# Patient Record
Sex: Male | Born: 1970 | Race: Black or African American | Hispanic: No | Smoking: Current every day smoker
Health system: Southern US, Community
[De-identification: ages and names within clinical notes are randomized; demographics above are authoritative.]

## PROBLEM LIST (undated history)

## (undated) ENCOUNTER — Emergency Department (HOSPITAL_COMMUNITY): Admission: EM | Payer: Self-pay | Source: Home / Self Care

## (undated) DIAGNOSIS — G459 Transient cerebral ischemic attack, unspecified: Secondary | ICD-10-CM

---

## 2012-03-29 ENCOUNTER — Encounter (HOSPITAL_COMMUNITY): Payer: Self-pay | Admitting: *Deleted

## 2012-03-29 ENCOUNTER — Emergency Department (HOSPITAL_COMMUNITY)
Admission: EM | Admit: 2012-03-29 | Discharge: 2012-03-29 | Disposition: A | Discharge status: Home / Self Care | Payer: Self-pay | Attending: Emergency Medicine | Admitting: Emergency Medicine

## 2012-03-29 DIAGNOSIS — F17200 Nicotine dependence, unspecified, uncomplicated: Secondary | ICD-10-CM | POA: Insufficient documentation

## 2012-03-29 DIAGNOSIS — Y9367 Activity, basketball: Secondary | ICD-10-CM | POA: Insufficient documentation

## 2012-03-29 DIAGNOSIS — H1130 Conjunctival hemorrhage, unspecified eye: Secondary | ICD-10-CM | POA: Insufficient documentation

## 2012-03-29 DIAGNOSIS — Y9239 Other specified sports and athletic area as the place of occurrence of the external cause: Secondary | ICD-10-CM | POA: Insufficient documentation

## 2012-03-29 DIAGNOSIS — W219XXA Striking against or struck by unspecified sports equipment, initial encounter: Secondary | ICD-10-CM | POA: Insufficient documentation

## 2012-03-29 MED ORDER — TOBRAMYCIN 0.3 % OP SOLN
2.0000 [drp] | Freq: Once | OPHTHALMIC | Status: AC
  Administered 2012-03-29: 2 [drp] via OPHTHALMIC
  Filled 2012-03-29: qty 5

## 2012-03-29 MED ORDER — TETRACAINE HCL 0.5 % OP SOLN
1.0000 [drp] | Freq: Once | OPHTHALMIC | Status: AC
  Administered 2012-03-29: 1 [drp] via OPHTHALMIC
  Filled 2012-03-29: qty 2

## 2012-03-29 MED ORDER — FLUORESCEIN SODIUM 1 MG OP STRP
1.0000 | ORAL_STRIP | Freq: Once | OPHTHALMIC | Status: AC
  Administered 2012-03-29: 1 via OPHTHALMIC

## 2012-03-29 NOTE — ED Notes (Signed)
Poked in lt eye when playing basketball

## 2012-03-31 NOTE — ED Provider Notes (Signed)
History     CSN: [id number]  Arrival date & time 03/29/12  1749   First MD Initiated Contact with Patient 03/29/12 1947      Chief Complaint  Patient presents with  . Eye Injury    (Consider location/radiation/quality/duration/timing/severity/associated sxs/prior treatment) HPI Comments: Nicholas Schmidt was playing basketball 3 days ago when he was accidentally poked in the left eye by his opponents finger.  He reports irritation which is worsened with blinking and increased clear eye tearing.  He has developed red eye medially and also states was seeing "spots" in his left field of vision immediately after the event which has since resolved.  His vision is now at baseline.  He has found no alleviators for his symptoms.  The history is provided by the patient.    History reviewed. No pertinent past medical history.  History reviewed. No pertinent past surgical history.  History reviewed. No pertinent family history.  History  Substance Use Topics  . Smoking status: Current Every Day Smoker  . Smokeless tobacco: Not on file  . Alcohol Use: No      Review of Systems  Constitutional: Negative for fever.  HENT: Negative for congestion, sore throat and facial swelling.   Eyes: Positive for pain and redness.  Respiratory: Negative for shortness of breath.   Cardiovascular: Negative for chest pain.  Gastrointestinal: Negative for nausea and vomiting.  Musculoskeletal: Negative for joint swelling and arthralgias.  Skin: Negative.  Negative for rash and wound.  Neurological: Negative for dizziness, light-headedness, numbness and headaches.  Psychiatric/Behavioral: Negative.     Allergies  Review of patient's allergies indicates no known allergies.  Home Medications  No current outpatient prescriptions on file.  BP 125/69  Pulse 76  Resp 18  SpO2 100%  Physical Exam  Nursing note and vitals reviewed. Constitutional: He appears well-developed and well-nourished.  HENT:   Head: Normocephalic and atraumatic.  Eyes: EOM are normal. Pupils are equal, round, and reactive to light. Right eye exhibits no discharge. Left eye exhibits discharge. Left eye exhibits no chemosis and no exudate. Left conjunctiva is injected. Left conjunctiva has a hemorrhage.  Slit lamp exam:      The left eye shows no corneal abrasion, no corneal flare, no hyphema and no fluorescein uptake.       Draining clear tears.  Eyelids everted with no fb.  Visual acuity 20/15 os,od, ou  Neck: Neck supple.  Cardiovascular: Normal rate.   Pulmonary/Chest: Effort normal.  Abdominal: Bowel sounds are normal.  Musculoskeletal: Normal range of motion.  Neurological: He is alert.  Skin: Skin is warm and dry.  Psychiatric: He has a normal mood and affect.    ED Course  Procedures (including critical care time)  Labs Reviewed - No data to display No results found.   1. Subconjunctival hemorrhage, traumatic       MDM  Pt given tobrex drops,  First dose given in ed.  Referral to Dr. Lita Mains for recheck if any new sx.  Reassurance given subconjunctival hemorrhage will resolve, but slowly.  The patient appears reasonably screened and/or stabilized for discharge and I doubt any other medical condition or other [facility] requiring further screening, evaluation, or treatment in the ED at this time prior to discharge.         Burgess Amor, PA 03/31/12 0104

## 2012-03-31 NOTE — ED Provider Notes (Signed)
Medical screening examination/treatment/procedure(s) were performed by non-physician practitioner and as supervising physician I was immediately available for consultation/collaboration.   Shelda Jakes, MD 03/31/12 [phone]

## 2012-04-16 ENCOUNTER — Emergency Department (HOSPITAL_COMMUNITY)
Admission: EM | Admit: 2012-04-16 | Discharge: 2012-04-16 | Disposition: A | Discharge status: Home / Self Care | Payer: Self-pay | Attending: Emergency Medicine | Admitting: Emergency Medicine

## 2012-04-16 ENCOUNTER — Encounter (HOSPITAL_COMMUNITY): Payer: Self-pay | Admitting: *Deleted

## 2012-04-16 ENCOUNTER — Emergency Department (HOSPITAL_COMMUNITY): Payer: Self-pay

## 2012-04-16 DIAGNOSIS — M25461 Effusion, right knee: Secondary | ICD-10-CM

## 2012-04-16 DIAGNOSIS — F17200 Nicotine dependence, unspecified, uncomplicated: Secondary | ICD-10-CM | POA: Insufficient documentation

## 2012-04-16 DIAGNOSIS — M25469 Effusion, unspecified knee: Secondary | ICD-10-CM | POA: Insufficient documentation

## 2012-04-16 DIAGNOSIS — M1710 Unilateral primary osteoarthritis, unspecified knee: Secondary | ICD-10-CM | POA: Insufficient documentation

## 2012-04-16 MED ORDER — HYDROCODONE-ACETAMINOPHEN 5-325 MG PO TABS: 1.0000 | ORAL_TABLET | Freq: Four times a day (QID) | ORAL | Status: AC | PRN

## 2012-04-16 NOTE — ED Notes (Signed)
C/o R knee pain x 1 year.  Localized to knee w/out radiation. Aching in character.  Relieved by ice and heat, but only for short time.  Patient has been using ibuprofen and ace wrap.

## 2012-04-16 NOTE — ED Notes (Signed)
Unable to locate

## 2012-04-16 NOTE — ED Provider Notes (Signed)
History     CSN: [id number]  Arrival date & time 04/16/12  1856   None     Chief Complaint  Patient presents with  . Knee Pain    (Consider location/radiation/quality/duration/timing/severity/associated sxs/prior treatment) HPI Comments: Pt states he stands and walks a lot at work but does not recall any injury to the knee.  No fever or chills.    Patient is a 41 y.o. male presenting with knee pain. The history is provided by the patient. No language interpreter was used.  Knee Pain This is a new problem. Episode onset: 2 days ago. The problem has been gradually worsening. Associated symptoms include joint swelling. Pertinent negatives include no chills, fever, numbness or weakness. The symptoms are aggravated by walking. He has tried NSAIDs, ice and heat for the symptoms. The treatment provided no relief.    History reviewed. No pertinent past medical history.  History reviewed. No pertinent past surgical history.  History reviewed. No pertinent family history.  History  Substance Use Topics  . Smoking status: Current Every Day Smoker  . Smokeless tobacco: Not on file  . Alcohol Use: No      Review of Systems  Constitutional: Negative for fever and chills.  Musculoskeletal: Positive for joint swelling.       Knee pain  Skin: Negative for wound.  Neurological: Negative for weakness and numbness.  All other systems reviewed and are negative.    Allergies  Review of patient's allergies indicates no known allergies.  Home Medications   Current Outpatient Rx  Name Route Sig Dispense Refill  . HYDROCODONE-ACETAMINOPHEN 5-325 MG PO TABS Oral Take 1 tablet by mouth every 6 (six) hours as needed for pain. 20 tablet 0    BP 133/68  Pulse 76  Temp 98 F (36.7 C) (Oral)  Resp 18  Ht 6\' 3"  (1.905 m)  Wt 225 lb (102.059 kg)  BMI 28.12 kg/m2  SpO2 98%  Physical Exam  Nursing note and vitals reviewed. Constitutional: He is oriented to person, place, and time. He  appears well-developed and well-nourished.  HENT:  Head: Normocephalic and atraumatic.  Eyes: EOM are normal.  Neck: Normal range of motion.  Cardiovascular: Normal rate, regular rhythm, normal heart sounds and intact distal pulses.   Pulmonary/Chest: Effort normal and breath sounds normal. No respiratory distress.  Abdominal: Soft. He exhibits no distension. There is no tenderness.  Musculoskeletal: He exhibits tenderness.       Right knee: He exhibits decreased range of motion, swelling and effusion. He exhibits no deformity, no laceration, no erythema, normal alignment, no LCL laxity and normal patellar mobility. tenderness found.  Neurological: He is alert and oriented to person, place, and time.  Skin: Skin is warm and dry.  Psychiatric: He has a normal mood and affect. Judgment normal.    ED Course  Procedures (including critical care time)  Labs Reviewed - No data to display Dg Knee Complete 4 Views Right  04/16/2012  *RADIOLOGY REPORT*  Clinical Data: Pain and swelling  RIGHT KNEE - COMPLETE 4+ VIEW  Comparison: None.  Findings: There is a moderate sized suprapatellar joint effusion.  There is sharpening of the tibial spines.  No fracture or subluxation.  No radiopaque foreign bodies or soft tissue calcifications.  An exostosis is noted arising the just below the tibial tubercle.  IMPRESSION:  1.  Joint effusion. 2.  Mild osteoarthritis.   Original Report Authenticated By: Rosealee Albee, M.D.      1. Knee effusion,  right       MDM  No fx  Knee immobilizer, ice Ibuprofen rx-hydrocodone, 20 F/u with dr. Hilda Lias or Carollee Herter, [place] 04/16/12 2133

## 2012-04-16 NOTE — ED Notes (Signed)
Rt knee swelling and pain, no injury

## 2012-04-17 NOTE — ED Provider Notes (Signed)
Medical screening examination/treatment/procedure(s) were performed by non-physician practitioner and as supervising physician I was immediately available for consultation/collaboration.   Jaiyon Wander W. Shenia Alan, MD 04/17/12 1241 

## 2012-05-21 ENCOUNTER — Emergency Department (HOSPITAL_COMMUNITY)
Admission: EM | Admit: 2012-05-21 | Discharge: 2012-05-21 | Disposition: A | Discharge status: Home / Self Care | Payer: Self-pay | Attending: Emergency Medicine | Admitting: Emergency Medicine

## 2012-05-21 ENCOUNTER — Encounter (HOSPITAL_COMMUNITY): Payer: Self-pay | Admitting: *Deleted

## 2012-05-21 DIAGNOSIS — M25569 Pain in unspecified knee: Secondary | ICD-10-CM | POA: Insufficient documentation

## 2012-05-21 DIAGNOSIS — M25461 Effusion, right knee: Secondary | ICD-10-CM

## 2012-05-21 DIAGNOSIS — F17200 Nicotine dependence, unspecified, uncomplicated: Secondary | ICD-10-CM | POA: Insufficient documentation

## 2012-05-21 DIAGNOSIS — Z791 Long term (current) use of non-steroidal anti-inflammatories (NSAID): Secondary | ICD-10-CM | POA: Insufficient documentation

## 2012-05-21 DIAGNOSIS — M25561 Pain in right knee: Secondary | ICD-10-CM

## 2012-05-21 DIAGNOSIS — M25469 Effusion, unspecified knee: Secondary | ICD-10-CM | POA: Insufficient documentation

## 2012-05-21 MED ORDER — NAPROXEN 500 MG PO TABS: 500.0000 mg | ORAL_TABLET | Freq: Two times a day (BID) | ORAL | Status: DC

## 2012-05-21 MED ORDER — HYDROCODONE-ACETAMINOPHEN 5-325 MG PO TABS: 1.0000 | ORAL_TABLET | Freq: Four times a day (QID) | ORAL | Status: AC | PRN

## 2012-05-21 NOTE — ED Notes (Signed)
Pt presents with right knee pain and swelling X 1 week.. No known injury. Rt leg proximal to knee has noted edema and warm to touch. Pulses distal to knee are present and strong. NAD noted.

## 2012-05-21 NOTE — ED Provider Notes (Signed)
Is a and a little is aHistory   This chart was scribed for Shelda Jakes, MD by Gerlean Ren, ED Scribe. This patient was seen in room APFT24/APFT24 and the patient's care was started at 3:32 PM    CSN: [id number]  Arrival date & time 05/21/12  1434   First MD Initiated Contact with Patient 05/21/12 1521      Chief Complaint  Patient presents with  . Knee Pain     The history is provided by the patient. No language interpreter was used.   Nicholas Schmidt is a 41 y.o. male who presents to the Emergency Department complaining of one week of gradual onset right knee pain and swelling with no known recent or past injury, fall, or trauma as cause.  Pt denies any numbness or tingling in right foot, locking or feeling that joint is loose but reports popping during movement.  Pt was seen here 10/18 for similar symptoms that resolved without intervention.  Pt has no h/o chronic medical conditions.  Pt is a current everyday smoker but denies alcohol use.   History reviewed. No pertinent past medical history.  History reviewed. No pertinent past surgical history.  History reviewed. No pertinent family history.  History  Substance Use Topics  . Smoking status: Current Every Day Smoker  . Smokeless tobacco: Not on file  . Alcohol Use: No      Review of Systems  Musculoskeletal:       Right knee pain.  Right knee swelling.  Neurological: Negative for numbness.    Allergies  Review of patient's allergies indicates no known allergies.  Home Medications   Current Outpatient Rx  Name  Route  Sig  Dispense  Refill  . IBUPROFEN 200 MG PO TABS   Oral   Take 800 mg by mouth every 8 (eight) hours as needed. Knee pain (OTC Medication)         . HYDROCODONE-ACETAMINOPHEN 5-325 MG PO TABS   Oral   Take 1-2 tablets by mouth every 6 (six) hours as needed for pain.   14 tablet   0   . NAPROXEN 500 MG PO TABS   Oral   Take 1 tablet (500 mg total) by mouth 2 (two) times daily.   14  tablet   0     BP 128/73  Pulse 90  Temp 98.3 F (36.8 C) (Oral)  Resp 18  Ht 6\' 3"  (1.905 m)  Wt 215 lb (97.523 kg)  BMI 26.87 kg/m2  SpO2 98%  Physical Exam  Nursing note and vitals reviewed. Constitutional: He is oriented to person, place, and time. He appears well-developed and well-nourished.  HENT:  Head: Normocephalic and atraumatic.  Mouth/Throat: Oropharynx is clear and moist.  Eyes: Conjunctivae normal and EOM are normal.  Neck: Normal range of motion. No tracheal deviation present.  Cardiovascular: Normal rate, regular rhythm and normal heart sounds.   No murmur heard. Pulmonary/Chest: Effort normal and breath sounds normal. He has no wheezes.  Abdominal: Bowel sounds are normal.  Musculoskeletal:       Right knee lateral joint line tenderness and effusion.  Knee cap normal.  Limited flexion, normal extension.   Neurological: He is alert and oriented to person, place, and time. No cranial nerve deficit. Coordination normal.  Skin: Skin is warm.  Psychiatric: He has a normal mood and affect.    ED Course  Procedures (including critical care time) DIAGNOSTIC STUDIES: Oxygen Saturation is 98% on room air, normal by my  interpretation.    COORDINATION OF CARE: 3:39 PM- Patient informed of clinical course, understands medical decision-making process, and agrees with plan.  Discussed seeing orthopedist.    Labs Reviewed - No data to display No results found. No results found for this or any previous visit. No results found.    1. Knee pain, right   2. Knee effusion, right       MDM  Patient was seen October 18 the same problem. X-rays at that time were reviewed and showed the some degenerative arthritis and effusion. Consistent with today's findings. Patient has not been able to see an orthopedist. However options have been provided for him this would be best if he could be seen by them but highly suspicious for internal ligament injury patellofemoral  syndrome or meniscal injury.  Patient still has his knee immobilizer he will use that we'll take anti-inflammatory medicines or regular basis and supplement with pain medicine as needed.     I personally performed the services described in this documentation, which was scribed in my presence. The recorded information has been reviewed and is accurate.           Shelda Jakes, MD 05/21/12 [phone]

## 2012-05-21 NOTE — ED Notes (Signed)
Pain , swelling rt knee for 1 week, No known injury,

## 2012-06-25 ENCOUNTER — Encounter (HOSPITAL_COMMUNITY): Payer: Self-pay

## 2012-06-25 ENCOUNTER — Emergency Department (HOSPITAL_COMMUNITY)
Admission: EM | Admit: 2012-06-25 | Discharge: 2012-06-25 | Disposition: A | Discharge status: Home / Self Care | Payer: Self-pay | Attending: Emergency Medicine | Admitting: Emergency Medicine

## 2012-06-25 DIAGNOSIS — L299 Pruritus, unspecified: Secondary | ICD-10-CM | POA: Insufficient documentation

## 2012-06-25 DIAGNOSIS — N51 Disorders of male genital organs in diseases classified elsewhere: Secondary | ICD-10-CM | POA: Insufficient documentation

## 2012-06-25 DIAGNOSIS — Z791 Long term (current) use of non-steroidal anti-inflammatories (NSAID): Secondary | ICD-10-CM | POA: Insufficient documentation

## 2012-06-25 DIAGNOSIS — N5089 Other specified disorders of the male genital organs: Secondary | ICD-10-CM

## 2012-06-25 DIAGNOSIS — F17200 Nicotine dependence, unspecified, uncomplicated: Secondary | ICD-10-CM | POA: Insufficient documentation

## 2012-06-25 NOTE — ED Provider Notes (Signed)
History    This chart was scribed for Geoffery Lyons, MD, MD by Smitty Pluck, ED Scribe. The patient was seen in room APA11 and the patient's care was started at 7:32AM.   CSN: [id number]  Arrival date & time 06/25/12  1324    Chief Complaint  Patient presents with  . Pruritis     The history is provided by the patient. No language interpreter was used.   Nicholas Schmidt is a 41 y.o. male who presents to the Emergency Department complaining of constant, moderate skin warts on penis onset 1 month ago. Pt reports that he is married and is monogamous. Reports having moderate pruritus. He denies penile discharge, bleeding from warts, dysuria, fever, chills, nausea, vomiting and any other symptoms.   History reviewed. No pertinent past medical history.  History reviewed. No pertinent past surgical history.  No family history on file.  History  Substance Use Topics  . Smoking status: Current Every Day Smoker  . Smokeless tobacco: Not on file  . Alcohol Use: No      Review of Systems  All other systems reviewed and are negative.   10 Systems reviewed and all are negative for acute change except as noted in the HPI.   Allergies  Review of patient's allergies indicates no known allergies.  Home Medications   Current Outpatient Rx  Name  Route  Sig  Dispense  Refill  . IBUPROFEN 200 MG PO TABS   Oral   Take 800 mg by mouth every 8 (eight) hours as needed. Knee pain (OTC Medication)         . NAPROXEN 500 MG PO TABS   Oral   Take 1 tablet (500 mg total) by mouth 2 (two) times daily.   14 tablet   0     BP 145/72  Pulse 89  Temp 97.9 F (36.6 C) (Oral)  Resp 20  Ht 6\' 3"  (1.905 m)  Wt 225 lb (102.059 kg)  BMI 28.12 kg/m2  SpO2 96%  Physical Exam  Nursing note and vitals reviewed. Constitutional: He is oriented to person, place, and time. He appears well-developed and well-nourished. No distress.  HENT:  Head: Normocephalic and atraumatic.  Eyes: EOM are  normal. Pupils are equal, round, and reactive to light.  Neck: Normal range of motion. Neck supple. No tracheal deviation present.  Pulmonary/Chest: Effort normal. No respiratory distress.  Abdominal: Soft. He exhibits no distension.  Genitourinary:       Multiple fleshy lesions present on shaft of penis. Otherwise penis appears normal.   Musculoskeletal: Normal range of motion.  Neurological: He is alert and oriented to person, place, and time.  Skin: Skin is warm and dry.  Psychiatric: He has a normal mood and affect. His behavior is normal.    ED Course  Procedures (including critical care time) DIAGNOSTIC STUDIES: Oxygen Saturation is 96% on room air, adequate by my interpretation.    COORDINATION OF CARE: 7:36 AM Discussed ED treatment with pt    Labs Reviewed - No data to display No results found.   No diagnosis found.    MDM  I suspect these are warts.  He needs to see dermatology.  Follow up information given.      I personally performed the services described in this documentation, which was scribed in my presence. The recorded information has been reviewed and is accurate.      Geoffery Lyons, MD 06/29/12 [phone]

## 2012-06-25 NOTE — ED Notes (Signed)
Pt c/o skin tags on penis with itching for a month.

## 2012-08-22 ENCOUNTER — Emergency Department (HOSPITAL_COMMUNITY)
Admission: EM | Admit: 2012-08-22 | Discharge: 2012-08-22 | Disposition: A | Discharge status: Home / Self Care | Payer: Self-pay | Attending: Emergency Medicine | Admitting: Emergency Medicine

## 2012-08-22 ENCOUNTER — Emergency Department (HOSPITAL_COMMUNITY): Payer: Self-pay

## 2012-08-22 ENCOUNTER — Encounter (HOSPITAL_COMMUNITY): Payer: Self-pay

## 2012-08-22 DIAGNOSIS — F17200 Nicotine dependence, unspecified, uncomplicated: Secondary | ICD-10-CM | POA: Insufficient documentation

## 2012-08-22 DIAGNOSIS — R51 Headache: Secondary | ICD-10-CM | POA: Insufficient documentation

## 2012-08-22 DIAGNOSIS — R42 Dizziness and giddiness: Secondary | ICD-10-CM | POA: Insufficient documentation

## 2012-08-22 DIAGNOSIS — H538 Other visual disturbances: Secondary | ICD-10-CM | POA: Insufficient documentation

## 2012-08-22 DIAGNOSIS — R61 Generalized hyperhidrosis: Secondary | ICD-10-CM | POA: Insufficient documentation

## 2012-08-22 LAB — COMPREHENSIVE METABOLIC PANEL
Albumin: 3.8 g/dL (ref 3.5–5.2)
Alkaline Phosphatase: 86 U/L (ref 39–117)
BUN: 9 mg/dL (ref 6–23)
Creatinine, Ser: 1.06 mg/dL (ref 0.50–1.35)
Potassium: 4.4 mEq/L (ref 3.5–5.1)
Total Protein: 7.6 g/dL (ref 6.0–8.3)

## 2012-08-22 LAB — CBC WITH DIFFERENTIAL/PLATELET
Basophils Absolute: 0 10*3/uL (ref 0.0–0.1)
Basophils Relative: 0 % (ref 0–1)
Eosinophils Absolute: 0.2 10*3/uL (ref 0.0–0.7)
Hemoglobin: 14.6 g/dL (ref 13.0–17.0)
MCH: 34 pg (ref 26.0–34.0)
MCHC: 34 g/dL (ref 30.0–36.0)
Monocytes Relative: 7 % (ref 3–12)
Neutrophils Relative %: 56 % (ref 43–77)
Platelets: 192 10*3/uL (ref 150–400)
RDW: 12.7 % (ref 11.5–15.5)

## 2012-08-22 MED ORDER — SODIUM CHLORIDE 0.9 % IV BOLUS (SEPSIS): 1000.0000 mL | Freq: Once | INTRAVENOUS | Status: DC

## 2012-08-22 MED ORDER — TRAMADOL HCL 50 MG PO TABS: 50.0000 mg | ORAL_TABLET | Freq: Four times a day (QID) | ORAL | Status: DC | PRN

## 2012-08-22 NOTE — ED Notes (Signed)
Pt alert & oriented x4, stable gait. Patient given discharge instructions, paperwork & prescription(s). Patient  instructed to stop at the registration desk to finish any additional paperwork. Patient verbalized understanding. Pt left department w/ no further questions. 

## 2012-08-22 NOTE — ED Notes (Signed)
Dr Zammit at bedside. 

## 2012-08-22 NOTE — ED Notes (Signed)
Pt reports for the past 3 weeks has had "white spots" in his visual field and had had dizziness.  Reports today head started hurting around 11 or 1200.  Pain is in left temple area.  Reports bent over today and when stood up, vision was gone and felt like was going to pass out.  Was also diaphoretic per pt's wife.  Wife caught pt before pt fell and said pt was also tachycardic.  CBG was 89 per wife.

## 2012-08-22 NOTE — ED Provider Notes (Signed)
History    This chart was scribed for Nicholas Lennert, MD by Melba Coon, ED Scribe. The patient was seen in room APA15/APA15 and the patient's care was started at 6:13PM.    CSN: [id number]  Arrival date & time 08/22/12  1745   First MD Initiated Contact with Patient 08/22/12 1804      No chief complaint on file.   (Consider location/radiation/quality/duration/timing/severity/associated sxs/prior treatment) Patient is a 42 y.o. male presenting with headaches. The history is provided by the patient. No language interpreter was used.  Headache Onset quality:  Gradual Duration:  1 day Timing:  Constant Progression:  Worsening Chronicity:  New Similar to prior headaches: no   Context comment:  Blurred vision Relieved by:  Nothing Worsened by:  Nothing tried Ineffective treatments:  None tried Associated symptoms: dizziness   Associated symptoms: no fever    Nicholas Schmidt is a 42 y.o. male who presents to the Emergency Department complaining of constant, moderate to severe headache with an onset this morning with associated dizziness since this morning and blurred vision (sees intermittent white spots) since 3 weeks ago. He reports his white dots are close enough to where he can "touch" them. He reports he could feel himself "rocking" with near syncope and diaphoresis and he was about to fall until his wife caught him. No known allergies. No other pertinent medical symptoms.  No past medical history on file.  No past surgical history on file.  No family history on file.  History  Substance Use Topics  . Smoking status: Current Every Day Smoker  . Smokeless tobacco: Not on file  . Alcohol Use: No     Review of Systems  Constitutional: Negative for fever.  Eyes: Positive for visual disturbance (blurred vision).  Neurological: Positive for dizziness and headaches.  All other systems reviewed and are negative.   Allergies  Review of patient's allergies indicates no  known allergies.  Home Medications   Current Outpatient Rx  Name  Route  Sig  Dispense  Refill  . ibuprofen (ADVIL,MOTRIN) 200 MG tablet   Oral   Take 800 mg by mouth every 8 (eight) hours as needed. Knee pain (OTC Medication)         . naproxen (NAPROSYN) 500 MG tablet   Oral   Take 1 tablet (500 mg total) by mouth 2 (two) times daily.   14 tablet   0     There were no vitals taken for this visit.  Physical Exam  Nursing note and vitals reviewed. Constitutional: He is oriented to person, place, and time. He appears well-developed.  HENT:  Head: Normocephalic and atraumatic.  Eyes: Conjunctivae and EOM are normal. No scleral icterus.  Neck: Neck supple. No thyromegaly present.  Cardiovascular: Normal rate and regular rhythm.  Exam reveals no gallop and no friction rub.   No murmur heard. Pulmonary/Chest: No stridor. He has no wheezes. He has no rales. He exhibits no tenderness.  Abdominal: He exhibits no distension. There is no tenderness. There is no rebound.  Musculoskeletal: Normal range of motion. He exhibits no edema.  Lymphadenopathy:    He has no cervical adenopathy.  Neurological: He is oriented to person, place, and time. Coordination normal.  Skin: No rash noted. No erythema.  Psychiatric: He has a normal mood and affect. His behavior is normal.    ED Course  Procedures (including critical care time)  DIAGNOSTIC STUDIES: Oxygen Saturation is 96% on room air, adequate by my interpretation.  COORDINATION OF CARE:  6:16PM - IV fluids, head CT without contrast, CBC with differential, and CMP will be ordered for Sydnee Cabal.    Labs Reviewed - No data to display No results found.   No diagnosis found.    MDM     The chart was scribed for me under my direct supervision.  I personally performed the history, physical, and medical decision making and all procedures in the evaluation of this patient.Nicholas Lennert, MD 08/22/12 [phone]

## 2012-08-28 ENCOUNTER — Inpatient Hospital Stay (HOSPITAL_COMMUNITY)
Admission: EM | Admit: 2012-08-28 | Discharge: 2012-08-29 | DRG: 069 | Disposition: A | Discharge status: Home / Self Care | Payer: MEDICAID | Attending: Internal Medicine | Admitting: Internal Medicine

## 2012-08-28 ENCOUNTER — Ambulatory Visit (HOSPITAL_COMMUNITY)
Admit: 2012-08-28 | Discharge: 2012-08-28 | Disposition: A | Discharge status: Home / Self Care | Payer: Self-pay | Attending: Internal Medicine | Admitting: Internal Medicine

## 2012-08-28 ENCOUNTER — Emergency Department (HOSPITAL_COMMUNITY): Payer: Self-pay

## 2012-08-28 ENCOUNTER — Encounter (HOSPITAL_COMMUNITY): Payer: Self-pay

## 2012-08-28 DIAGNOSIS — F17200 Nicotine dependence, unspecified, uncomplicated: Secondary | ICD-10-CM | POA: Diagnosis present

## 2012-08-28 DIAGNOSIS — Z720 Tobacco use: Secondary | ICD-10-CM | POA: Diagnosis present

## 2012-08-28 DIAGNOSIS — G459 Transient cerebral ischemic attack, unspecified: Principal | ICD-10-CM | POA: Diagnosis present

## 2012-08-28 DIAGNOSIS — R51 Headache: Secondary | ICD-10-CM | POA: Diagnosis present

## 2012-08-28 LAB — BASIC METABOLIC PANEL
CO2: 28 mEq/L (ref 19–32)
Glucose, Bld: 104 mg/dL — ABNORMAL HIGH (ref 70–99)
Potassium: 4.6 mEq/L (ref 3.5–5.1)
Sodium: 141 mEq/L (ref 135–145)

## 2012-08-28 LAB — CBC WITH DIFFERENTIAL/PLATELET
Eosinophils Absolute: 0.1 10*3/uL (ref 0.0–0.7)
Eosinophils Relative: 2 % (ref 0–5)
Hemoglobin: 14.9 g/dL (ref 13.0–17.0)
Lymphs Abs: 2 10*3/uL (ref 0.7–4.0)
MCH: 34.6 pg — ABNORMAL HIGH (ref 26.0–34.0)
MCV: 102.1 fL — ABNORMAL HIGH (ref 78.0–100.0)
Monocytes Relative: 6 % (ref 3–12)
RBC: 4.31 MIL/uL (ref 4.22–5.81)

## 2012-08-28 MED ORDER — HEPARIN SODIUM (PORCINE) 5000 UNIT/ML IJ SOLN
5000.0000 [IU] | Freq: Three times a day (TID) | INTRAMUSCULAR | Status: DC
  Filled 2012-08-28: qty 1

## 2012-08-28 MED ORDER — NICOTINE 14 MG/24HR TD PT24
14.0000 mg | MEDICATED_PATCH | Freq: Every day | TRANSDERMAL | Status: DC
  Administered 2012-08-28: 14 mg via TRANSDERMAL
  Filled 2012-08-28: qty 1

## 2012-08-28 MED ORDER — SODIUM CHLORIDE 0.9 % IV SOLN: INTRAVENOUS | Status: DC

## 2012-08-28 MED ORDER — ASPIRIN 325 MG PO TABS
325.0000 mg | ORAL_TABLET | Freq: Every day | ORAL | Status: DC
  Administered 2012-08-29: 325 mg via ORAL
  Filled 2012-08-28: qty 1

## 2012-08-28 MED ORDER — ASPIRIN 81 MG PO CHEW
324.0000 mg | CHEWABLE_TABLET | Freq: Once | ORAL | Status: AC
  Administered 2012-08-28: 324 mg via ORAL
  Filled 2012-08-28: qty 4

## 2012-08-28 MED ORDER — ONDANSETRON HCL 4 MG/2ML IJ SOLN: 4.0000 mg | Freq: Three times a day (TID) | INTRAMUSCULAR | Status: AC | PRN

## 2012-08-28 NOTE — ED Provider Notes (Addendum)
History     CSN: [id number]  Arrival date & time 08/28/12  4098   First MD Initiated Contact with Patient 08/28/12 [phone]      Chief Complaint  Patient presents with  . Dizziness    (Consider location/radiation/quality/duration/timing/severity/associated sxs/prior treatment) HPI Comments: Nicholas Schmidt is a 42 y.o. 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  The nurse at the facility took his blood pressure which was 180/?.  He was given some vinegar water to drink and rested in a quiet room before presenting here.  His symptoms are completely resolved,  Except he has developed a slight nagging headache over his left forehead.  He reports seeing spots in his field of vision for the past month,  Lasting briefly several times weekly but is not typically accompanied by the other symptoms per above.  He has tried excedrin migraine as it was suggested this cough be a migraine problem,  But makes him too jittery.  He has a strong family history of htn.    The history is provided by the patient.    History reviewed. No pertinent past medical history.  History reviewed. No pertinent past surgical history.  History reviewed. No pertinent family history.  History  Substance Use Topics  . Smoking status: Current Every Day Smoker  . Smokeless tobacco: Not on file  . Alcohol Use: No      Review of Systems  HENT: Negative for congestion, sore throat, rhinorrhea, neck pain and neck stiffness.   Eyes: Positive for visual disturbance. Negative for pain.  Respiratory: Negative for cough, chest tightness and shortness of breath.   Cardiovascular: Negative for chest pain.   Gastrointestinal: Negative for nausea and abdominal pain.  Genitourinary: Negative.   Musculoskeletal: Negative for joint swelling and arthralgias.  Skin: Negative.  Negative for rash and wound.  Neurological: Positive for speech difficulty, light-headedness, numbness and headaches. Negative for dizziness, syncope and weakness.  Psychiatric/Behavioral: Negative.     Allergies  Review of patient's allergies indicates no known allergies.  Home Medications   Current Outpatient Rx  Name  Route  Sig  Dispense  Refill  . traMADol (ULTRAM) 50 MG tablet   Oral   Take 1 tablet (50 mg total) by mouth every 6 (six) hours as needed for pain.   15 tablet   0     BP 130/67  Pulse 81  Temp(Src) 97.8 F (36.6 C) (Oral)  Resp 20  Ht 6\' 3"  (1.905 m)  Wt 220 lb (99.791 kg)  BMI 27.5 kg/m2  SpO2 100%  Physical Exam  Nursing note and vitals reviewed. Constitutional: He is oriented to person, place, and time. He appears well-developed and well-nourished.  HENT:  Head: Normocephalic and atraumatic.  Mouth/Throat: Oropharynx is clear and moist.  Eyes: EOM are normal. Pupils are equal, round, and reactive to light.  Neck: Normal range of motion. Neck supple.  Cardiovascular: Normal rate and normal heart sounds.   Pulmonary/Chest: Effort normal.  Abdominal: Soft. There is no tenderness.  Musculoskeletal: Normal range of motion.  Lymphadenopathy:    He has no cervical adenopathy.  Neurological: He is alert and oriented to person, place, and time. He  has normal strength. No sensory deficit. Gait normal. GCS eye subscore is 4. GCS verbal subscore is 5. GCS motor subscore is 6.  Normal heel-shin, normal rapid alternating movements. Cranial nerves III-XII intact.  No pronator drift.  Skin: Skin is warm and dry. No rash noted.  Psychiatric: He has a normal mood and affect. His speech is normal and behavior is normal. Thought content normal. Cognition and memory are normal.    ED Course   Procedures (including critical care time)  Labs Reviewed - No data to display No results found.   No diagnosis found.  Ct head ordered. Spoke with radiology - mri not available today.  Will plan teleneurology consult as well.  MDM  Ct head normal,  Labs normal.  Pt was evaluated by teleneurology  Dr. Guy Sandifer who recommends admission for TIA workup given risk factors.   Although states migraine is also still in differential list.   2:05 PM spoke to Dr. Karilyn Cota who will admit pt.   Date: 08/28/2012  Rate: 71  Rhythm: normal sinus rhythm  QRS Axis: normal  Intervals: normal  ST/T Wave abnormalities: normal  Conduction Disutrbances:Incomplete RBBB  Narrative Interpretation:   Old EKG Reviewed: unchanged       Burgess Amor, PA 08/28/12 1308  Burgess Amor, PA 08/28/12 1510  Burgess Amor, PA 08/28/12 1511  Burgess Amor, PA-C 09/13/12 1659

## 2012-08-28 NOTE — ED Notes (Signed)
Complain of dizziness from blood pressure being elevlated

## 2012-08-28 NOTE — ED Notes (Signed)
Pt is being transferred to [facility] for MRI then brought back to room 341.  Explained to pt, verbalized understanding.

## 2012-08-28 NOTE — ED Provider Notes (Signed)
Medical screening examination/treatment/procedure(s) were performed by non-physician practitioner and as supervising physician I was immediately available for consultation/collaboration.   Benny Lennert, MD 08/28/12 [phone]

## 2012-08-28 NOTE — H&P (Signed)
[facility] History and Physical  Nicholas Schmidt YNW:[id number] DOB: [birth year] DOA: 08/28/2012  Referring physician: ER physician.    Chief Complaint: Visual disturbance and sensory disturbance in the left side of the body.  HPI: Nicholas Schmidt is a 42 y.o. male who presents with symptoms of visual obscurations this morning at 5:30 AM associated with a left temporal headache. This lasted approximately 15 minutes. He was then still not feeling well and went into work. Approximately 9 AM he started to get tingling in his left forearm and his left foot. The left forearm also felt somewhat weak. He was able to walk. He also during this time noticed that he had dysarthria. 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 He was therefore sent to the emergency room. In the ER, his blood pressure was noted to be normal. Approximately one week ago, he noticed also some dizziness after he stooped to pick something up followed by almost a syncopal episode. He also has been describing visual disturbance about a week ago. He is a smoker. He is not normally hypertensive. He is not diabetic.   Review of Systems: .  Apart from history of present illness other systems negative.    Social History:  He is married, lives with his wife. 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 He is a smoker. Does not drink alcohol nor does he do any other drugs.  No Known Allergies  History reviewed. No pertinent family history. no family history of cerebrovascular disease. (be sure to complete)  Prior to Admission medications   Medication Sig Start Date End Date Taking? Authorizing Provider  aspirin-acetaminophen-caffeine (EXCEDRIN MIGRAINE) [phone] MG per tablet Take 1 tablet by mouth every 6 (six) hours as needed for pain.   Yes Historical Provider, MD  traMADol (ULTRAM) 50 MG tablet Take 1 tablet (50 mg  total) by mouth every 6 (six) hours as needed for pain. 08/22/12  Yes Benny Lennert, MD   Physical Exam: Filed Vitals:   08/28/12 1259 08/28/12 1300 08/28/12 1400 08/28/12 1442  BP: 122/78 123/76 119/80   Pulse:  82    Temp:    97.7 F (36.5 C)  TempSrc:      Resp: 16 16 16    Height:      Weight:      SpO2: 97% 97%       General:  He looks systemically well. He is alert.  Eyes: External ocular movements are normal. Funduscopy appears to be normal. It was her equal and reactive to light.  ENT: No abnormalities.  Neck: No lymphadenopathy.  Cardiovascular: Heart sounds are present and normal without murmurs or gallop rhythm. No carotid bruits.  Respiratory: Lung fields are clear.  Abdomen: Soft, nontender. No hepatosplenomegaly.  Skin: No rash.  Musculoskeletal: No abnormalities.  Psychiatric: Appropriate affect.  Neurologic: Alert and orientated. There does appear to be slight weakness in the left arm and left leg compared to the right side. There does not appear to be gross focal neurological deficits. There is no facial asymmetry. His speech is now normal.  Labs on Admission:  Basic Metabolic Panel:  Recent Labs Lab 08/22/12 1826 08/28/12 1058  NA 137 141  K 4.4 4.6  CL 100 106  CO2 28 28  GLUCOSE 87 104*  BUN 9 10  CREATININE 1.06 0.91  CALCIUM 9.7 9.5   Liver Function  Tests:  Recent Labs Lab 08/22/12 1826  AST 18  ALT 9  ALKPHOS 86  BILITOT 0.9  PROT 7.6  ALBUMIN 3.8     CBC:  Recent Labs Lab 08/22/12 1826 08/28/12 1058  WBC 9.1 7.1  NEUTROABS 5.1 4.6  HGB 14.6 14.9  HCT 42.9 44.0  MCV 100.0 102.1*  PLT 192 165      Radiological Exams on Admission: Ct Head Wo Contrast  08/28/2012  *RADIOLOGY REPORT*  Clinical Data: Dizziness, weakness.  CT HEAD WITHOUT CONTRAST  Technique:  Contiguous axial images were obtained from the base of the skull through the vertex without contrast.  Comparison: 08/22/2012  Findings: No acute  intracranial abnormality.  Specifically, no hemorrhage, hydrocephalus, mass lesion, acute infarction, or significant intracranial injury.  No acute calvarial abnormality. Visualized paranasal sinuses and mastoids clear.  Orbital soft tissues unremarkable.  IMPRESSION: Unremarkable study.   Original Report Authenticated By: Charlett Nose, M.D.     EKG: Independently reviewed. Normal sinus rhythm, no acute ST-T wave changes. No evidence of left ventricular hypertrophy.  Assessment/Plan Principal Problem:   TIA (transient ischemic attack) Active Problems:   Tobacco abuse   1. TIA. Possible migraine. 2. Tobacco abuse.  Plan: 1. Admit to telemetry. 2. MRI brain scan. I think he needs to have this done today because the history is not quite typical for TIA nor is it quite typical for migraine headaches. We will send him to [facility] to have the scan done. 3. TIA workup. Further recommendations will depend on patient's hospital progress.   Code Status: Full code.  Family Communication: Discussed plan with patient at the bedside.   Disposition Plan: Home in medically stable.  Time spent: 45 minutes.  Wilson Singer [facility] Pager [phone].  If 7PM-7AM, please contact night-coverage [email or web address] Password [facility] 08/28/2012, 3:22 PM

## 2012-08-29 ENCOUNTER — Inpatient Hospital Stay (HOSPITAL_COMMUNITY): Payer: Self-pay

## 2012-08-29 LAB — LIPID PANEL
HDL: 32 mg/dL — ABNORMAL LOW (ref >39)
LDL Cholesterol: 79 mg/dL (ref 0–99)
Total CHOL/HDL Ratio: 5.5 RATIO
Triglycerides: 324 mg/dL — ABNORMAL HIGH (ref <150)

## 2012-08-29 LAB — VITAMIN B12: Vitamin B-12: 753 pg/mL (ref 211–911)

## 2012-08-29 LAB — RAPID URINE DRUG SCREEN, HOSP PERFORMED
Amphetamines: NOT DETECTED
Cocaine: NOT DETECTED
Opiates: NOT DETECTED
Tetrahydrocannabinol: NOT DETECTED

## 2012-08-29 LAB — HEMOGLOBIN A1C: Mean Plasma Glucose: 91 mg/dL (ref <117)

## 2012-08-29 MED ORDER — ASPIRIN 325 MG PO TABS: 325.0000 mg | ORAL_TABLET | Freq: Every day | ORAL | Status: DC

## 2012-08-29 NOTE — ED Provider Notes (Signed)
Medical screening examination/treatment/procedure(s) were performed by non-physician practitioner and as supervising physician I was immediately available for consultation/collaboration.   Bijan Ridgley L Archer Moist, MD 08/29/12 0708 

## 2012-08-29 NOTE — Progress Notes (Signed)
Pt discharged with instructions.  Pt verbalized understanding.  MD was notified of the Carotid Doppler results prior to discharge.  She went back and and spoke with him about the results.  Pt left the floor ambulation in stable condition.

## 2012-08-29 NOTE — Discharge Summary (Signed)
Physician Discharge Summary  Patient ID: Nicholas Schmidt MRN: [id number] DOB/AGE: 1970/12/14 42 y.o.  Admit date: 08/28/2012 Discharge date: 08/29/2012  Discharge Diagnoses:  Principal Problem:   TIA (transient ischemic attack) Active Problems:   Tobacco abuse     Medication List    TAKE these medications       aspirin 325 MG tablet  Take 1 tablet (325 mg total) by mouth daily.     aspirin-acetaminophen-caffeine 250-250-65 MG per tablet  Commonly known as:  EXCEDRIN MIGRAINE  Take 1 tablet by mouth every 6 (six) hours as needed for pain.     traMADol 50 MG tablet  Commonly known as:  ULTRAM  Take 1 tablet (50 mg total) by mouth every 6 (six) hours as needed for pain.            Discharge Orders   Future Orders Complete By Expires     Activity as tolerated - No restrictions  As directed     Diet general  As directed     Discharge instructions  As directed     Comments:      Quit smoking       Follow-up Information   Follow up with health department. 2 consider outpatient echocardiogram       Disposition: 01-Home or Self Care  Discharged Condition: stable  Consults:  none  Labs:   Results for orders placed during the hospital encounter of 08/28/12 (from the past 48 hour(s))  BASIC METABOLIC PANEL     Status: Abnormal   Collection Time    08/28/12 10:58 AM      Result Value Range   Sodium 141  135 - 145 mEq/L   Potassium 4.6  3.5 - 5.1 mEq/L   Chloride 106  96 - 112 mEq/L   CO2 28  19 - 32 mEq/L   Glucose, Bld 104 (*) 70 - 99 mg/dL   BUN 10  6 - 23 mg/dL   Creatinine, Ser 8.29  0.50 - 1.35 mg/dL   Calcium 9.5  8.4 - 56.2 mg/dL   GFR calc non Af Amer >90  >90 mL/min   GFR calc Af Amer >90  >90 mL/min   Comment:            The eGFR has been calculated     using the CKD EPI equation.     This calculation has not been     validated in all clinical     situations.     eGFR's persistently     <90 mL/min signify     possible Chronic Kidney Disease.   CBC WITH DIFFERENTIAL     Status: Abnormal   Collection Time    08/28/12 10:58 AM      Result Value Range   WBC 7.1  4.0 - 10.5 K/uL   RBC 4.31  4.22 - 5.81 MIL/uL   Hemoglobin 14.9  13.0 - 17.0 g/dL   HCT 13.0  86.5 - 78.4 %   MCV 102.1 (*) 78.0 - 100.0 fL   MCH 34.6 (*) 26.0 - 34.0 pg   MCHC 33.9  30.0 - 36.0 g/dL   RDW 69.6  29.5 - 28.4 %   Platelets 165  150 - 400 K/uL   Neutrophils Relative 64  43 - 77 %   Neutro Abs 4.6  1.7 - 7.7 K/uL   Lymphocytes Relative 28  12 - 46 %   Lymphs Abs 2.0  0.7 - 4.0 K/uL   Monocytes Relative  6  3 - 12 %   Monocytes Absolute 0.4  0.1 - 1.0 K/uL   Eosinophils Relative 2  0 - 5 %   Eosinophils Absolute 0.1  0.0 - 0.7 K/uL   Basophils Relative 0  0 - 1 %   Basophils Absolute 0.0  0.0 - 0.1 K/uL  HEMOGLOBIN A1C     Status: None   Collection Time    08/28/12  3:21 PM      Result Value Range   Hemoglobin A1C 4.8  <5.7 %   Comment: (NOTE)                                                                               According to the ADA Clinical Practice Recommendations for 2011, when     HbA1c is used as a screening test:      >=6.5%   Diagnostic of Diabetes Mellitus               (if abnormal result is confirmed)     5.7-6.4%   Increased risk of developing Diabetes Mellitus     References:Diagnosis and Classification of Diabetes Mellitus,Diabetes     Care,2011,34(Suppl 1):S62-S69 and Standards of Medical Care in             Diabetes - 2011,Diabetes Care,2011,34 (Suppl 1):S11-S61.   Mean Plasma Glucose 91  <117 mg/dL  TSH     Status: None   Collection Time    08/28/12  3:45 PM      Result Value Range   TSH 1.166  0.350 - 4.500 uIU/mL  VITAMIN B12     Status: None   Collection Time    08/28/12  3:45 PM      Result Value Range   Vitamin B-12 753  211 - 911 pg/mL  LIPID PANEL     Status: Abnormal   Collection Time    08/29/12  4:54 AM      Result Value Range   Cholesterol 176  0 - 200 mg/dL   Triglycerides 161 (*) <150 mg/dL   HDL  32 (*) >09 mg/dL   Total CHOL/HDL Ratio 5.5     VLDL 65 (*) 0 - 40 mg/dL   LDL Cholesterol 79  0 - 99 mg/dL   Comment:            Total Cholesterol/HDL:CHD Risk     Coronary Heart Disease Risk Table                         Men   Women      1/2 Average Risk   3.4   3.3      Average Risk       5.0   4.4      2 X Average Risk   9.6   7.1      3 X Average Risk  23.4   11.0                Use the calculated Patient Ratio     above and the CHD Risk Table     to determine the patient's CHD Risk.  ATP III CLASSIFICATION (LDL):      <100     mg/dL   Optimal      782-956  mg/dL   Near or Above                        Optimal      130-159  mg/dL   Borderline      213-086  mg/dL   High      >578     mg/dL   Very High  URINE RAPID DRUG SCREEN (HOSP PERFORMED)     Status: None   Collection Time    08/29/12  6:47 AM      Result Value Range   Opiates NONE DETECTED  NONE DETECTED   Cocaine NONE DETECTED  NONE DETECTED   Benzodiazepines NONE DETECTED  NONE DETECTED   Amphetamines NONE DETECTED  NONE DETECTED   Tetrahydrocannabinol NONE DETECTED  NONE DETECTED   Barbiturates NONE DETECTED  NONE DETECTED   Comment:            DRUG SCREEN FOR MEDICAL PURPOSES     ONLY.  IF CONFIRMATION IS NEEDED     FOR ANY PURPOSE, NOTIFY LAB     WITHIN 5 DAYS.                LOWEST DETECTABLE LIMITS     FOR URINE DRUG SCREEN     Drug Class       Cutoff (ng/mL)     Amphetamine      1000     Barbiturate      200     Benzodiazepine   200     Tricyclics       300     Opiates          300     Cocaine          300     THC              50    Diagnostics:  Ct Head Wo Contrast  08/28/2012  *RADIOLOGY REPORT*  Clinical Data: Dizziness, weakness.  CT HEAD WITHOUT CONTRAST  Technique:  Contiguous axial images were obtained from the base of the skull through the vertex without contrast.  Comparison: 08/22/2012  Findings: No acute intracranial abnormality.  Specifically, no hemorrhage, hydrocephalus,  mass lesion, acute infarction, or significant intracranial injury.  No acute calvarial abnormality. Visualized paranasal sinuses and mastoids clear.  Orbital soft tissues unremarkable.  IMPRESSION: Unremarkable study.   Original Report Authenticated By: Charlett Nose, M.D.    Ct Head Wo Contrast  08/22/2012  *RADIOLOGY REPORT*  Clinical Data: 42 year old male with blurred vision.  CT HEAD WITHOUT CONTRAST  Technique:  Contiguous axial images were obtained from the base of the skull through the vertex without contrast.  Comparison: None  Findings: No intracranial abnormalities are identified, including mass lesion or mass effect, hydrocephalus, extra-axial fluid collection, midline shift, hemorrhage, or acute infarction.  The visualized bony calvarium is unremarkable.  IMPRESSION: Unremarkable noncontrast head CT   Original Report Authenticated By: Harmon Pier, M.D.    Mri Brain Without Contrast  08/28/2012  *RADIOLOGY REPORT*  Clinical Data:  Visual and sensory disturbance left-sided body. Left temporal headache.  MRI BRAIN WITHOUT CONTRAST MRA HEAD WITHOUT CONTRAST  Technique: Multiplanar, multiecho pulse sequences of the brain and surrounding structures were obtained according to standard protocol without intravenous contrast.  Angiographic images of the head were obtained using MRA  technique without contrast.  Comparison: 08/28/2012 head CT.  MRI HEAD  Findings:  Portions of exam are motion degraded.  No acute infarct.  No intracranial hemorrhage.  No intracranial mass lesion detected on this unenhanced exam.  No hydrocephalus.  Mild exophthalmos.  Partial opacification aerated aspect left pterygoid plate. Mild mucosal thickening inferior aspect of the maxillary sinuses. Minimal mucosal thickening paranasal sinuses otherwise noted.  Prominent soft tissue posterior-superior nasopharynx may represent adenoidal tissue which is more than expected for the patient's age. Mucosa abnormality not excluded although felt  to be a less likely consideration.  IMPRESSION: No acute infarct.  Please see above.  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  Proximal right internal carotid artery stenosis not excluded.  Left middle cerebral artery branch arises from the proximal A1 segment of the left anterior cerebral artery.  Nonvisualization PICAs.  No significant stenosis of the vertebral arteries or basilar artery.  Mild irregularity prominent AICA branches.  Mild irregularity proximal left superior cerebellar artery.  Mild irregularity distal branches posterior cerebral artery bilaterally.  No aneurysm or vascular malformation is noted.  IMPRESSION: Asymmetric caliber of the internal carotid arteries smaller on the right as detailed above.  Although this may be congenital configuration, proximal right internal carotid artery stenosis not excluded.  Branch vessel irregularity.   Original Report Authenticated By: Lacy Duverney, M.D.    Mr Mra Head/brain Wo Cm  08/28/2012  *RADIOLOGY REPORT*  Clinical Data:  Visual and sensory disturbance left-sided body. Left temporal headache.  MRI BRAIN WITHOUT CONTRAST MRA HEAD WITHOUT CONTRAST  Technique: Multiplanar, multiecho pulse sequences of the brain and surrounding structures were obtained according to standard protocol without intravenous contrast.  Angiographic images of the head were obtained using MRA technique without contrast.  Comparison: 08/28/2012 head CT.  MRI HEAD  Findings:  Portions of exam are motion degraded.  No acute infarct.  No intracranial hemorrhage.  No intracranial mass lesion detected on this unenhanced exam.  No hydrocephalus.  Mild exophthalmos.  Partial opacification aerated aspect left pterygoid plate. Mild mucosal  thickening inferior aspect of the maxillary sinuses. Minimal mucosal thickening paranasal sinuses otherwise noted.  Prominent soft tissue posterior-superior nasopharynx may represent adenoidal tissue which is more than expected for the patient's age. Mucosa abnormality not excluded although felt to be a less likely consideration.  IMPRESSION: No acute infarct.  Please see above.  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  Proximal right internal carotid artery stenosis not excluded.  Left middle cerebral artery branch arises from the proximal A1 segment of the left anterior cerebral artery.  Nonvisualization PICAs.  No significant stenosis of the vertebral arteries or basilar artery.  Mild irregularity prominent AICA branches.  Mild irregularity proximal left superior cerebellar artery.  Mild irregularity distal branches posterior cerebral artery bilaterally.  No aneurysm or vascular malformation is noted.  IMPRESSION: Asymmetric caliber of the internal carotid arteries smaller on the right as detailed above.  Although this may be congenital configuration, proximal right internal carotid artery  stenosis not excluded.  Branch vessel irregularity.   Original Report Authenticated By: Lacy Duverney, M.D.     EKG: NSR incomplete RBBB  Full Code   Hospital Course: See H&P for complete admission details. The patient is a 42 year old black male smoker who presented with transient speech difficulty and paresthesias of the left hand and foot. In the emergency room, he had normal vital signs. He had slight weakness of the left arm and leg her H&P. His speech was normal at that time. CT brain showed nothing acute. He was placed on observation. Started on aspirin. MRI  showed no infarct. Carotid Dopplers showed no critical ischemia. Patient's symptoms completely resolved. LDL was not high. Echocardiogram was ordered, but patient was unwilling to stay the weekend to have it performed on Monday. As his symptoms have resolved, this is reasonable. He is encouraged to quit smoking and followup with the health department to consider echocardiogram. He will be maintained on aspirin. His blood pressure and heart rhythm remained normal during the stay  Discharge Exam:  Blood pressure 107/62, pulse 82, temperature 98.2 F (36.8 C), temperature source Axillary, resp. rate 20, height 6\' 3"  (1.905 m), weight 94.212 kg (207 lb 11.2 oz), SpO2 97.00%.  General: Comfortable. Alert and oriented. Speech clear and fluent. Lungs clear to auscultation bilaterally without wheeze rhonchi or rales Cardiovascular regular rate rhythm without murmurs gallops rubs Abdomen soft nontender nondistended Extremities no clubbing cyanosis or edema Neurologic: Cranial nerves intact. Motor strength and sensation intact.  SignedChristiane Ha 08/29/2012, 11:49 AM

## 2012-08-30 LAB — FOLATE RBC: RBC Folate: 697 ng/mL — ABNORMAL HIGH (ref >=366)

## 2012-09-02 NOTE — Progress Notes (Signed)
UR Chart Review Completed  

## 2012-09-14 NOTE — ED Provider Notes (Signed)
Medical screening examination/treatment/procedure(s) were performed by non-physician practitioner and as supervising physician I was immediately available for consultation/collaboration.   Benny Lennert, MD 09/14/12 [phone]

## 2012-11-15 ENCOUNTER — Encounter (HOSPITAL_COMMUNITY): Payer: Self-pay | Admitting: *Deleted

## 2012-11-15 ENCOUNTER — Emergency Department (HOSPITAL_COMMUNITY)
Admission: EM | Admit: 2012-11-15 | Discharge: 2012-11-15 | Disposition: A | Discharge status: Home / Self Care | Payer: Self-pay | Attending: Emergency Medicine | Admitting: Emergency Medicine

## 2012-11-15 DIAGNOSIS — Z8673 Personal history of transient ischemic attack (TIA), and cerebral infarction without residual deficits: Secondary | ICD-10-CM | POA: Insufficient documentation

## 2012-11-15 DIAGNOSIS — S40269A Insect bite (nonvenomous) of unspecified shoulder, initial encounter: Secondary | ICD-10-CM | POA: Insufficient documentation

## 2012-11-15 DIAGNOSIS — Z7982 Long term (current) use of aspirin: Secondary | ICD-10-CM | POA: Insufficient documentation

## 2012-11-15 DIAGNOSIS — W57XXXA Bitten or stung by nonvenomous insect and other nonvenomous arthropods, initial encounter: Secondary | ICD-10-CM | POA: Insufficient documentation

## 2012-11-15 DIAGNOSIS — F17200 Nicotine dependence, unspecified, uncomplicated: Secondary | ICD-10-CM | POA: Insufficient documentation

## 2012-11-15 DIAGNOSIS — Y929 Unspecified place or not applicable: Secondary | ICD-10-CM | POA: Insufficient documentation

## 2012-11-15 DIAGNOSIS — S40862A Insect bite (nonvenomous) of left upper arm, initial encounter: Secondary | ICD-10-CM

## 2012-11-15 DIAGNOSIS — Y93E1 Activity, personal bathing and showering: Secondary | ICD-10-CM | POA: Insufficient documentation

## 2012-11-15 HISTORY — DX: Transient cerebral ischemic attack, unspecified: G45.9

## 2012-11-15 NOTE — ED Provider Notes (Signed)
History     CSN: [id number]  Arrival date & time 11/15/12  0736   First MD Initiated Contact with Patient 11/15/12 0809      Chief Complaint  Patient presents with  . Tick Removal    (Consider location/radiation/quality/duration/timing/severity/associated sxs/prior treatment) HPI Comments: Nicholas Schmidt is a 42 y.o. Male presenting with an embedded tick in his left axilla.  He found it this morning while showering and his wife attempted to remove it with a tweezers but was unable.  He reports itching at the site,  No pain, drainage, redness and denies fever or rash.     The history is provided by the patient.    Past Medical History  Diagnosis Date  . TIA (transient ischemic attack)     History reviewed. No pertinent past surgical history.  History reviewed. No pertinent family history.  History  Substance Use Topics  . Smoking status: Current Every Day Smoker -- 0.50 packs/day    Types: Cigarettes  . Smokeless tobacco: Not on file  . Alcohol Use: No      Review of Systems  Constitutional: Negative for fever.  HENT: Negative for sore throat and neck pain.   Eyes: Negative.   Respiratory: Negative.   Cardiovascular: Negative.   Gastrointestinal: Negative for nausea.  Genitourinary: Negative.   Musculoskeletal: Negative for joint swelling and arthralgias.  Skin: Negative.  Negative for rash and wound.  Neurological: Negative for weakness and headaches.  Psychiatric/Behavioral: Negative.     Allergies  Review of patient's allergies indicates no known allergies.  Home Medications   Current Outpatient Rx  Name  Route  Sig  Dispense  Refill  . aspirin 325 MG tablet   Oral   Take 1 tablet (325 mg total) by mouth daily.         Marland Kitchen aspirin-acetaminophen-caffeine (EXCEDRIN MIGRAINE) 250-250-65 MG per tablet   Oral   Take 1 tablet by mouth every 6 (six) hours as needed for pain.         . traMADol (ULTRAM) 50 MG tablet   Oral   Take 1 tablet (50 mg  total) by mouth every 6 (six) hours as needed for pain.   15 tablet   0     BP 130/83  Pulse 71  Temp(Src) 98.3 F (36.8 C) (Oral)  Ht 6\' 3"  (1.905 m)  Wt 220 lb (99.791 kg)  BMI 27.5 kg/m2  SpO2 100%  Physical Exam  Constitutional: He appears well-developed and well-nourished. No distress.  HENT:  Head: Normocephalic.  Neck: Neck supple.  Cardiovascular: Normal rate.   Pulmonary/Chest: Effort normal.  Musculoskeletal: Normal range of motion. He exhibits no edema.  Skin:  Small deer tick embedded in left axilla.  It is firmly attached without engorgement.    ED Course  FOREIGN BODY REMOVAL Date/Time: 11/15/2012 8:25 AM Performed by: Burgess Amor Authorized by: Burgess Amor Consent: Verbal consent obtained. Risks and benefits: risks, benefits and alternatives were discussed Consent given by: patient Patient identity confirmed: verbally with patient Time out: Immediately prior to procedure a "time out" was called to verify the correct patient, procedure, equipment, support staff and site/side marked as required. Body area: skin General location: trunk Location details: left axilla Removal mechanism: forceps Complexity: simple 1 objects recovered. Objects recovered: tick,  entirely Patient tolerance: Patient tolerated the procedure well with no immediate complications.   (including critical care time)  Labs Reviewed - No data to display No results found.   1. Tick bite of axillary  region, left, initial encounter       MDM  Discussed signs and symptoms requiring further treatment.  Advised to watch for any signs of fever, chills, body aches headaches or rash or other flulike symptoms over the next 2-3 weeks.        Burgess Amor, PA-C 11/15/12 [phone]

## 2012-11-15 NOTE — ED Notes (Signed)
Small tick L axilla area.

## 2012-11-15 NOTE — ED Provider Notes (Signed)
Medical screening examination/treatment/procedure(s) were performed by non-physician practitioner and as supervising physician I was immediately available for consultation/collaboration.  Shelda Jakes, MD 11/15/12 [phone]

## 2014-05-19 IMAGING — CR DG KNEE COMPLETE 4+V*R*
4 series · 4 of 4 positions shown · non-contrast
Comparison: None.

CLINICAL DATA: Pain and swelling

RIGHT KNEE - COMPLETE 4+ VIEW

[view not recorded (1 of 4)]
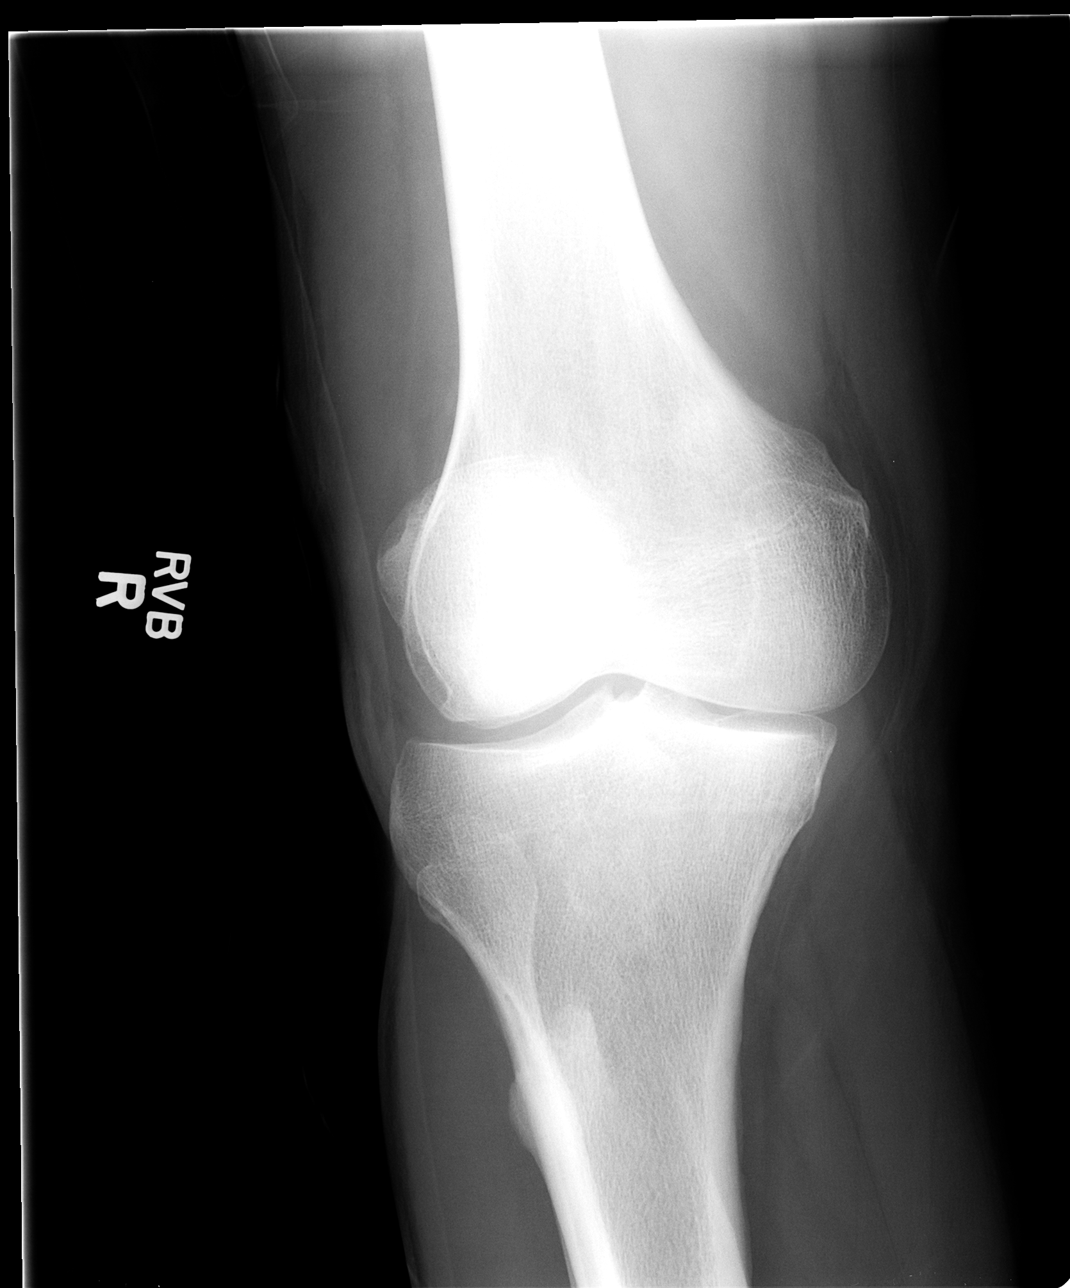

[view not recorded (2 of 4)]
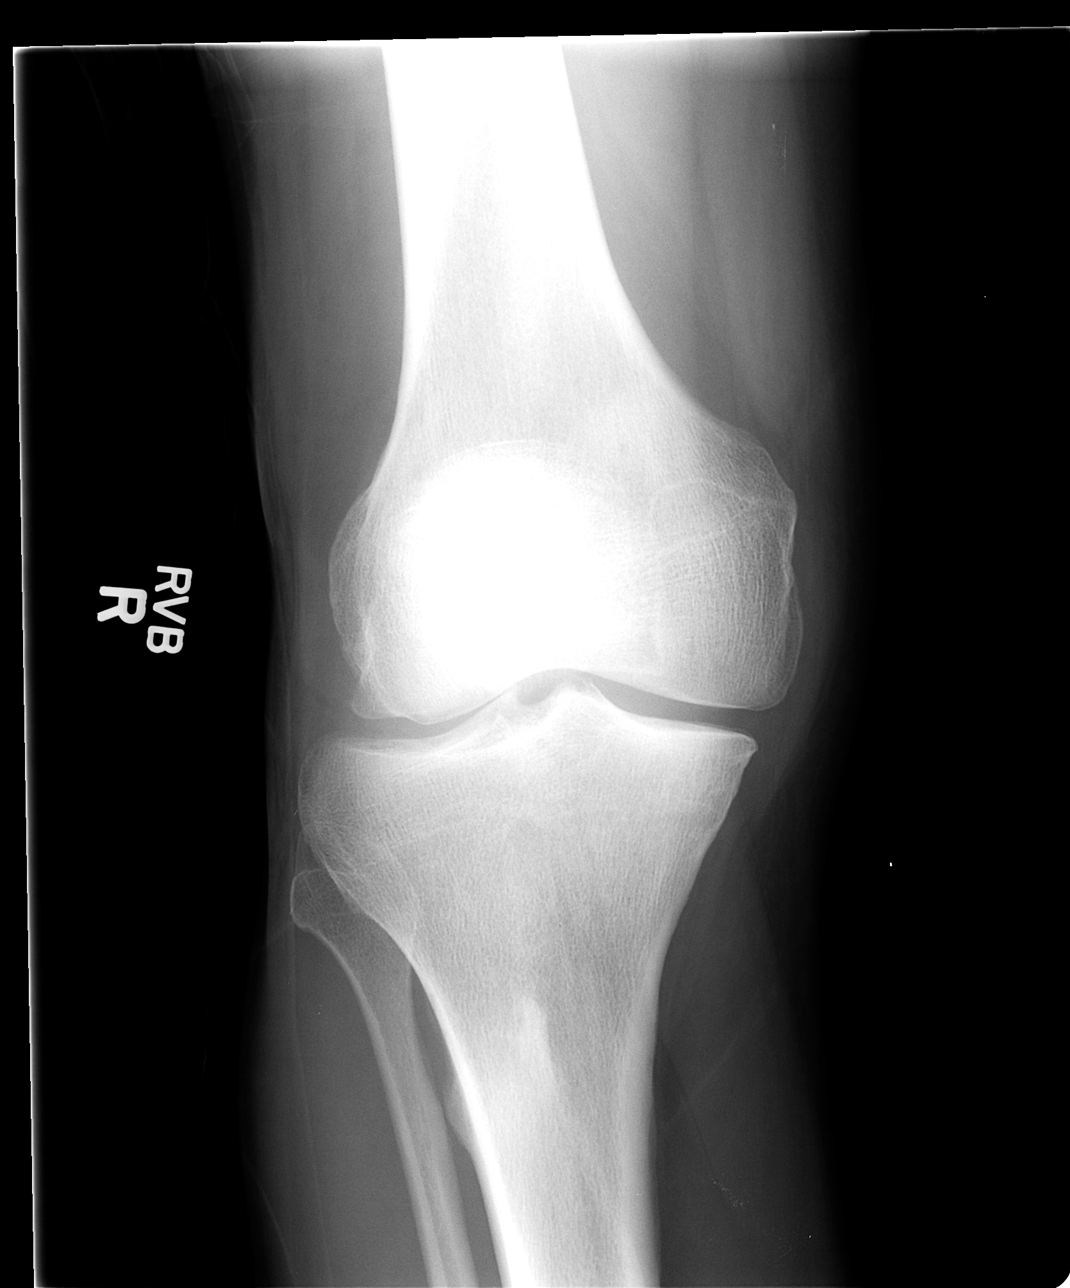

[view not recorded (3 of 4)]
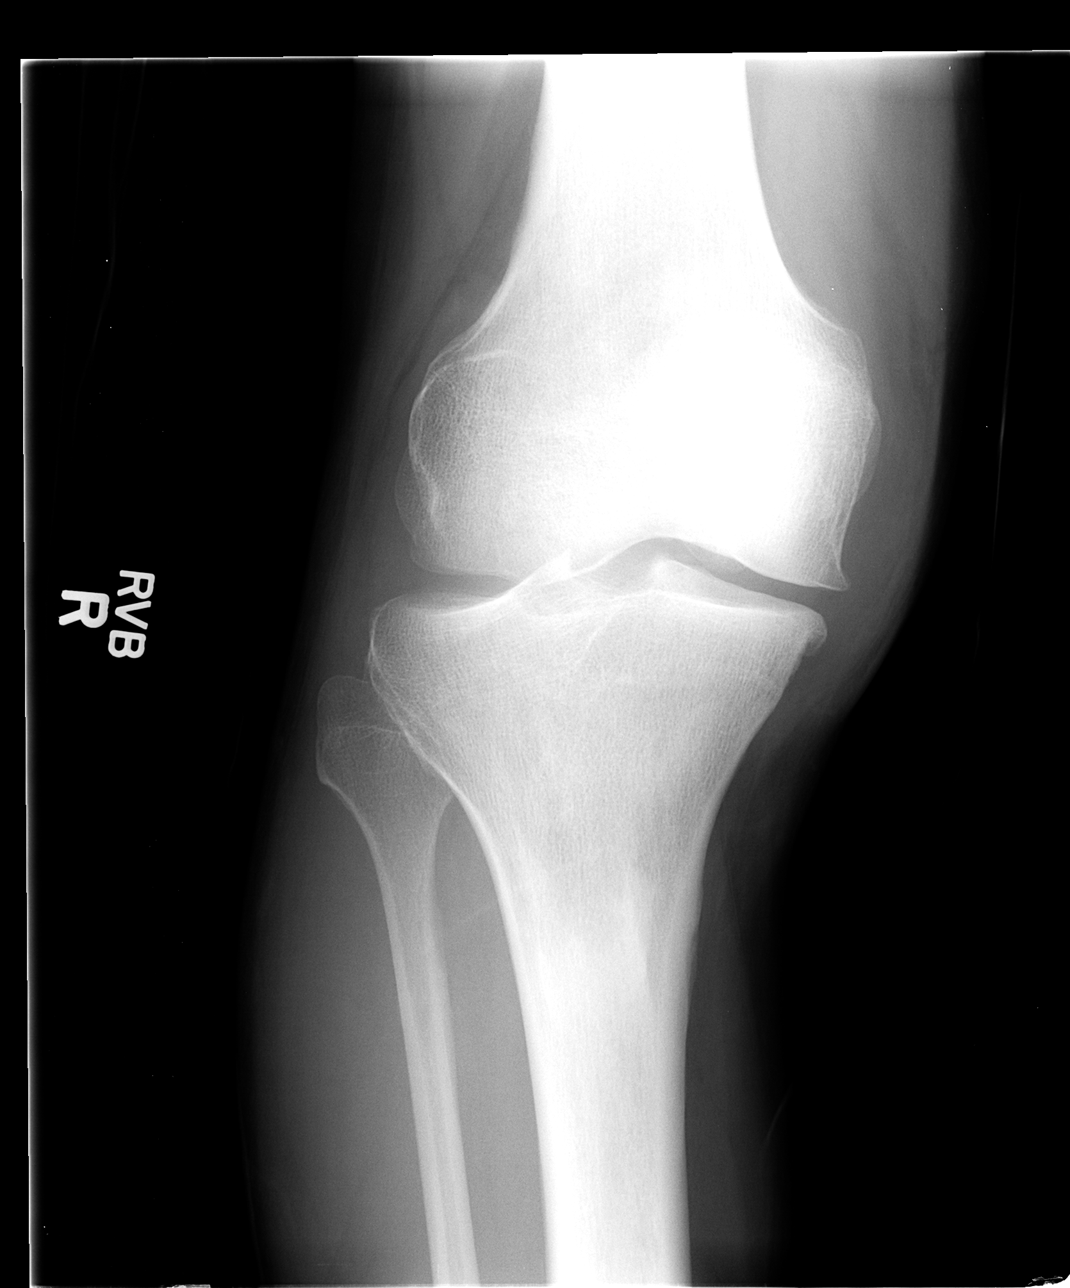

[view not recorded (4 of 4)]
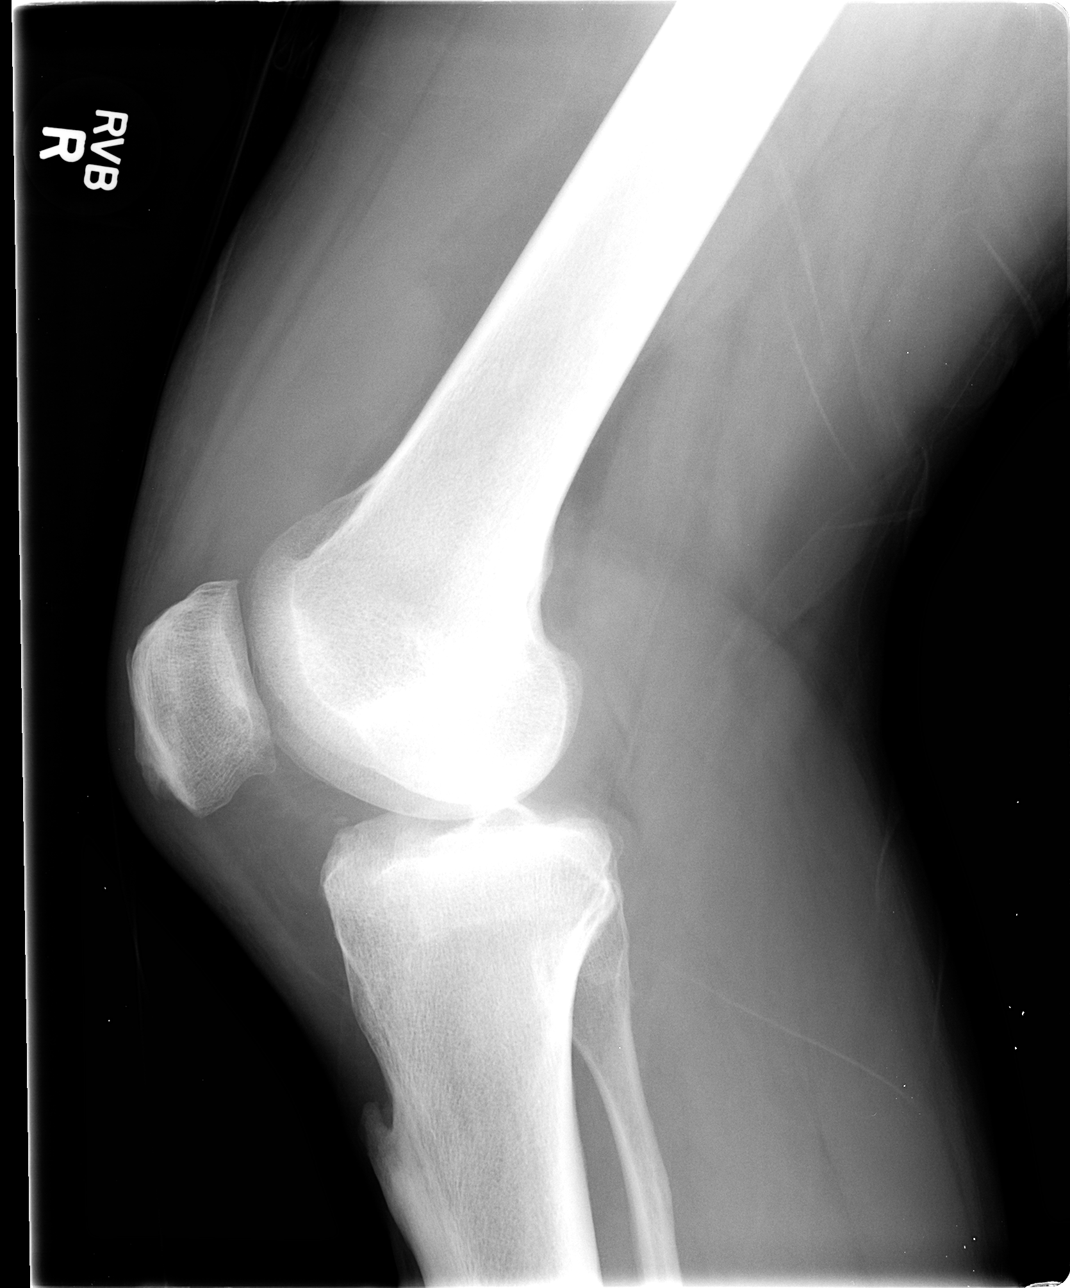

[4 of 4 positions shown; findings below may reference images not displayed]

FINDINGS: There is a moderate sized suprapatellar joint effusion.

There is sharpening of the tibial spines.  No fracture or
subluxation.

No radiopaque foreign bodies or soft tissue calcifications.

An exostosis is noted arising the just below the tibial tubercle.
IMPRESSION: 1.  Joint effusion.
2.  Mild osteoarthritis.

## 2014-11-20 ENCOUNTER — Encounter (HOSPITAL_COMMUNITY): Payer: Self-pay | Admitting: Emergency Medicine

## 2014-11-20 ENCOUNTER — Emergency Department (HOSPITAL_COMMUNITY): Payer: PRIVATE HEALTH INSURANCE

## 2014-11-20 ENCOUNTER — Emergency Department (HOSPITAL_COMMUNITY)
Admission: EM | Admit: 2014-11-20 | Discharge: 2014-11-20 | Disposition: A | Discharge status: Home / Self Care | Payer: PRIVATE HEALTH INSURANCE | Attending: Emergency Medicine | Admitting: Emergency Medicine

## 2014-11-20 DIAGNOSIS — R112 Nausea with vomiting, unspecified: Secondary | ICD-10-CM | POA: Diagnosis not present

## 2014-11-20 DIAGNOSIS — Z7982 Long term (current) use of aspirin: Secondary | ICD-10-CM | POA: Insufficient documentation

## 2014-11-20 DIAGNOSIS — Z8673 Personal history of transient ischemic attack (TIA), and cerebral infarction without residual deficits: Secondary | ICD-10-CM | POA: Diagnosis not present

## 2014-11-20 DIAGNOSIS — Z79899 Other long term (current) drug therapy: Secondary | ICD-10-CM | POA: Diagnosis not present

## 2014-11-20 DIAGNOSIS — Z720 Tobacco use: Secondary | ICD-10-CM | POA: Diagnosis not present

## 2014-11-20 DIAGNOSIS — R1032 Left lower quadrant pain: Secondary | ICD-10-CM | POA: Insufficient documentation

## 2014-11-20 DIAGNOSIS — R197 Diarrhea, unspecified: Secondary | ICD-10-CM | POA: Insufficient documentation

## 2014-11-20 LAB — COMPREHENSIVE METABOLIC PANEL
ALT: 15 U/L — AB (ref 17–63)
AST: 23 U/L (ref 15–41)
Albumin: 4 g/dL (ref 3.5–5.0)
Alkaline Phosphatase: 67 U/L (ref 38–126)
Anion gap: 4 — ABNORMAL LOW (ref 5–15)
BUN: 9 mg/dL (ref 6–20)
CALCIUM: 8.8 mg/dL — AB (ref 8.9–10.3)
CHLORIDE: 107 mmol/L (ref 101–111)
CO2: 27 mmol/L (ref 22–32)
CREATININE: 0.97 mg/dL (ref 0.61–1.24)
GLUCOSE: 94 mg/dL (ref 65–99)
Potassium: 4.7 mmol/L (ref 3.5–5.1)
Sodium: 138 mmol/L (ref 135–145)
TOTAL PROTEIN: 7.3 g/dL (ref 6.5–8.1)
Total Bilirubin: 1 mg/dL (ref 0.3–1.2)

## 2014-11-20 LAB — CBC WITH DIFFERENTIAL/PLATELET
BASOS ABS: 0 10*3/uL (ref 0.0–0.1)
BASOS PCT: 0 % (ref 0–1)
EOS PCT: 1 % (ref 0–5)
Eosinophils Absolute: 0.1 10*3/uL (ref 0.0–0.7)
HEMATOCRIT: 43.2 % (ref 39.0–52.0)
Hemoglobin: 14.3 g/dL (ref 13.0–17.0)
LYMPHS ABS: 1.9 10*3/uL (ref 0.7–4.0)
Lymphocytes Relative: 25 % (ref 12–46)
MCH: 34.2 pg — ABNORMAL HIGH (ref 26.0–34.0)
MCHC: 33.1 g/dL (ref 30.0–36.0)
MCV: 103.3 fL — ABNORMAL HIGH (ref 78.0–100.0)
MONO ABS: 0.7 10*3/uL (ref 0.1–1.0)
MONOS PCT: 8 % (ref 3–12)
Neutro Abs: 5.1 10*3/uL (ref 1.7–7.7)
Neutrophils Relative %: 66 % (ref 43–77)
Platelets: 179 10*3/uL (ref 150–400)
RBC: 4.18 MIL/uL — AB (ref 4.22–5.81)
RDW: 12.9 % (ref 11.5–15.5)
WBC: 7.7 10*3/uL (ref 4.0–10.5)

## 2014-11-20 LAB — URINALYSIS, ROUTINE W REFLEX MICROSCOPIC
BILIRUBIN URINE: NEGATIVE
Glucose, UA: NEGATIVE mg/dL
HGB URINE DIPSTICK: NEGATIVE
Ketones, ur: NEGATIVE mg/dL
LEUKOCYTES UA: NEGATIVE
Nitrite: NEGATIVE
PH: 5.5 (ref 5.0–8.0)
Protein, ur: NEGATIVE mg/dL
SPECIFIC GRAVITY, URINE: 1.01 (ref 1.005–1.030)
Urobilinogen, UA: 0.2 mg/dL (ref 0.0–1.0)

## 2014-11-20 LAB — PROTIME-INR
INR: 1.12 (ref 0.00–1.49)
PROTHROMBIN TIME: 14.6 s (ref 11.6–15.2)

## 2014-11-20 LAB — LIPASE, BLOOD: Lipase: 20 U/L — ABNORMAL LOW (ref 22–51)

## 2014-11-20 MED ORDER — IOHEXOL 300 MG/ML  SOLN
100.0000 mL | Freq: Once | INTRAMUSCULAR | Status: AC | PRN
  Administered 2014-11-20: 100 mL via INTRAVENOUS

## 2014-11-20 MED ORDER — PROMETHAZINE HCL 25 MG PO TABS: 25.0000 mg | ORAL_TABLET | Freq: Four times a day (QID) | ORAL | Status: DC | PRN

## 2014-11-20 MED ORDER — IOHEXOL 300 MG/ML  SOLN
25.0000 mL | Freq: Once | INTRAMUSCULAR | Status: AC | PRN
  Administered 2014-11-20: 25 mL via ORAL

## 2014-11-20 MED ORDER — DICYCLOMINE HCL 20 MG PO TABS: 20.0000 mg | ORAL_TABLET | Freq: Two times a day (BID) | ORAL | Status: DC

## 2014-11-20 MED ORDER — DIPHENOXYLATE-ATROPINE 2.5-0.025 MG PO TABS: 1.0000 | ORAL_TABLET | Freq: Four times a day (QID) | ORAL | Status: DC | PRN

## 2014-11-20 MED ORDER — SODIUM CHLORIDE 0.9 % IV SOLN
Freq: Once | INTRAVENOUS | Status: AC
  Administered 2014-11-20: 18:00:00 via INTRAVENOUS

## 2014-11-20 NOTE — ED Notes (Signed)
Pt alert & oriented x4, stable gait. Patient given discharge instructions, paperwork & prescription(s). Patient  instructed to stop at the registration desk to finish any additional paperwork. Patient verbalized understanding. Pt left department w/ no further questions. 

## 2014-11-20 NOTE — ED Notes (Signed)
Patient complaining of abdominal pain x 2 weeks with vomiting blood starting today x 2.

## 2014-11-20 NOTE — ED Provider Notes (Signed)
CSN: [id number]     Arrival date & time 11/20/14  1704 History   First MD Initiated Contact with Patient 11/20/14 1720     Chief Complaint  Patient presents with  . Abdominal Pain  . Hematemesis     (Consider location/radiation/quality/duration/timing/severity/associated sxs/prior Treatment) HPI Comments: Patient presents with two-week history of left lower abdominal pain associated with diarrhea. Patient reports that the pain is in the left lower portion of the abdomen and at times just under his umbilicus. He has not had any upper abdominal pain or right-sided pain. Patient reports profuse watery diarrhea. Today, however, he developed nausea and vomiting. The second time he vomited, there was blood in his vomit. He has not had any coffee-ground emesis. He has not had any melena. No rectal bleeding.  Patient is a 44 y.o. male presenting with abdominal pain.  Abdominal Pain Associated symptoms: diarrhea, nausea and vomiting     Past Medical History  Diagnosis Date  . TIA (transient ischemic attack)    History reviewed. No pertinent past surgical history. History reviewed. No pertinent family history. History  Substance Use Topics  . Smoking status: Current Every Day Smoker -- 0.50 packs/day    Types: Cigarettes  . Smokeless tobacco: Not on file  . Alcohol Use: Yes     Comment: occasionally    Review of Systems  Gastrointestinal: Positive for nausea, vomiting, abdominal pain and diarrhea.  All other systems reviewed and are negative.     Allergies  Bee venom  Home Medications   Prior to Admission medications   Medication Sig Start Date End Date Taking? Authorizing Provider  aspirin 325 MG tablet Take 1 tablet (325 mg total) by mouth daily. Patient not taking: Reported on 11/20/2014 08/29/12   Christiane Ha, MD  dicyclomine (BENTYL) 20 MG tablet Take 1 tablet (20 mg total) by mouth 2 (two) times daily. 11/20/14   Gilda Crease, MD  diphenoxylate-atropine  (LOMOTIL) 2.5-0.025 MG per tablet Take 1 tablet by mouth 4 (four) times daily as needed for diarrhea or loose stools. 11/20/14   Gilda Crease, MD  promethazine (PHENERGAN) 25 MG tablet Take 1 tablet (25 mg total) by mouth every 6 (six) hours as needed for nausea or vomiting. 11/20/14   Gilda Crease, MD  traMADol (ULTRAM) 50 MG tablet Take 1 tablet (50 mg total) by mouth every 6 (six) hours as needed for pain. Patient not taking: Reported on 11/20/2014 08/22/12   Bethann Berkshire, MD   BP 124/69 mmHg  Pulse 78  Temp(Src) 98.9 F (37.2 C) (Oral)  Resp 16  Ht  (1.905 m)  Wt 205 lb (92.987 kg)  BMI 25.62 kg/m2  SpO2 100% Physical Exam  Constitutional: He is oriented to person, place, and time. He appears well-developed and well-nourished. No distress.  HENT:  Head: Normocephalic and atraumatic.  Right Ear: Hearing normal.  Left Ear: Hearing normal.  Nose: Nose normal.  Mouth/Throat: Oropharynx is clear and moist and mucous membranes are normal.  Eyes: Conjunctivae and EOM are normal. Pupils are equal, round, and reactive to light.  Neck: Normal range of motion. Neck supple.  Cardiovascular: Regular rhythm, S1 normal and S2 normal.  Exam reveals no gallop and no friction rub.   No murmur heard. Pulmonary/Chest: Effort normal and breath sounds normal. No respiratory distress. He exhibits no tenderness.  Abdominal: Soft. Normal appearance and bowel sounds are normal. There is no hepatosplenomegaly. There is tenderness in the suprapubic area and left lower quadrant.  There is no rebound, no guarding, no tenderness at McBurney's point and negative Murphy's sign. No hernia.  Musculoskeletal: Normal range of motion.  Neurological: He is alert and oriented to person, place, and time. He has normal strength. No cranial nerve deficit or sensory deficit. Coordination normal. GCS eye subscore is 4. GCS verbal subscore is 5. GCS motor subscore is 6.  Skin: Skin is warm, dry and intact. No  rash noted. No cyanosis.  Psychiatric: He has a normal mood and affect. His speech is normal and behavior is normal. Thought content normal.  Nursing note and vitals reviewed.   ED Course  Procedures (including critical care time) Labs Review Labs Reviewed  CBC WITH DIFFERENTIAL/PLATELET - Abnormal; Notable for the following:    RBC 4.18 (*)    MCV 103.3 (*)    MCH 34.2 (*)    All other components within normal limits  COMPREHENSIVE METABOLIC PANEL - Abnormal; Notable for the following:    Calcium 8.8 (*)    ALT 15 (*)    Anion gap 4 (*)    All other components within normal limits  LIPASE, BLOOD - Abnormal; Notable for the following:    Lipase 20 (*)    All other components within normal limits  URINALYSIS, ROUTINE W REFLEX MICROSCOPIC - Abnormal; Notable for the following:    Color, Urine STRAW (*)    All other components within normal limits  PROTIME-INR    Imaging Review Ct Abdomen Pelvis W Contrast  11/20/2014   CLINICAL DATA:  Pt reports having LLQ pain x 2 weeks with cramping and diarrhea. Pt also reports that he began to vomit blood earlier today while at work. Pt denies diabetes. Pt has hx of TIA  EXAM: CT ABDOMEN AND PELVIS WITH CONTRAST  TECHNIQUE: Multidetector CT imaging of the abdomen and pelvis was performed using the standard protocol following bolus administration of intravenous contrast.  CONTRAST:  25mL OMNIPAQUE IOHEXOL 300 MG/ML SOLN, OMNIPAQUE IOHEXOL 300 MG/ML SOLN  COMPARISON:  None.  FINDINGS: Lung bases:  Clear.  Heart normal size.  Liver, spleen, gallbladder, pancreas, adrenal glands, kidneys, ureters, bladder: Normal.  Lymph nodes:  No enlarged or abnormal lymph nodes.  Ascites: None.  Bowel:  Normal.  Appendix:  Normal.  Musculoskeletal: Disk degenerative changes at L5-S1 with facet degenerative change on the right at L4-L5 and L5-S1. No osteoblastic or osteolytic lesions.  IMPRESSION: 1. No acute findings. No findings to explain this patient's  symptoms of left lower quadrant pain and diarrhea. 2. Degenerative changes of lower lumbar spine as described. 3. No other abnormalities.  Normal appendix visualized.   Electronically Signed   By: Amie Portland M.D.   On: 11/20/2014 18:54     EKG Interpretation None      MDM   Final diagnoses:  LLQ pain  Diarrhea    Presented to the ER for evaluation of abdominal pain with diarrhea. Symptoms have been ongoing for 2 weeks. Patient now reports nausea and vomiting. He has had several episodes of vomiting today and the last time he vomited was blood neck. This is most consistent with a Mallory-Weiss tear. He has not had any melena. There has not been any coffee-ground emesis. Patient's diarrhea is of unclear etiology. Abdominal exam revealed mild tenderness in the left lower quadrant but CAT scan was normal. Patient reports that he has follow-up scheduled for colonoscopy with gastroenterology coming up. He was encouraged to follow up with this appointment. Consider upper endoscopy at the  same time.    Gilda Creasehristopher J Vassie Kugel, MD 11/20/14 [phone]

## 2014-11-20 NOTE — Discharge Instructions (Signed)

## 2015-01-18 ENCOUNTER — Encounter (HOSPITAL_COMMUNITY): Payer: Self-pay | Admitting: Emergency Medicine

## 2015-01-18 ENCOUNTER — Emergency Department (HOSPITAL_COMMUNITY)
Admission: EM | Admit: 2015-01-18 | Discharge: 2015-01-18 | Disposition: A | Discharge status: Home / Self Care | Payer: PRIVATE HEALTH INSURANCE | Attending: Emergency Medicine | Admitting: Emergency Medicine

## 2015-01-18 DIAGNOSIS — Z23 Encounter for immunization: Secondary | ICD-10-CM | POA: Insufficient documentation

## 2015-01-18 DIAGNOSIS — Y998 Other external cause status: Secondary | ICD-10-CM | POA: Diagnosis not present

## 2015-01-18 DIAGNOSIS — Y929 Unspecified place or not applicable: Secondary | ICD-10-CM | POA: Diagnosis not present

## 2015-01-18 DIAGNOSIS — Y9389 Activity, other specified: Secondary | ICD-10-CM | POA: Diagnosis not present

## 2015-01-18 DIAGNOSIS — Z720 Tobacco use: Secondary | ICD-10-CM | POA: Diagnosis not present

## 2015-01-18 DIAGNOSIS — Z8673 Personal history of transient ischemic attack (TIA), and cerebral infarction without residual deficits: Secondary | ICD-10-CM | POA: Insufficient documentation

## 2015-01-18 DIAGNOSIS — W260XXA Contact with knife, initial encounter: Secondary | ICD-10-CM | POA: Diagnosis not present

## 2015-01-18 DIAGNOSIS — S61412A Laceration without foreign body of left hand, initial encounter: Secondary | ICD-10-CM

## 2015-01-18 MED ORDER — TRAMADOL HCL 50 MG PO TABS: 50.0000 mg | ORAL_TABLET | Freq: Four times a day (QID) | ORAL | Status: DC | PRN

## 2015-01-18 MED ORDER — TRAMADOL HCL 50 MG PO TABS
50.0000 mg | ORAL_TABLET | Freq: Once | ORAL | Status: AC
  Administered 2015-01-18: 50 mg via ORAL
  Filled 2015-01-18: qty 1

## 2015-01-18 MED ORDER — TETANUS-DIPHTH-ACELL PERTUSSIS 5-2.5-18.5 LF-MCG/0.5 IM SUSP
0.5000 mL | Freq: Once | INTRAMUSCULAR | Status: AC
  Administered 2015-01-18: 0.5 mL via INTRAMUSCULAR
  Filled 2015-01-18: qty 0.5

## 2015-01-18 MED ORDER — LIDOCAINE-EPINEPHRINE (PF) 2 %-1:200000 IJ SOLN
10.0000 mL | Freq: Once | INTRAMUSCULAR | Status: AC
  Administered 2015-01-18: 5 mL
  Filled 2015-01-18: qty 20

## 2015-01-18 NOTE — Discharge Instructions (Signed)

## 2015-01-18 NOTE — ED Provider Notes (Signed)
CSN: [id number]     Arrival date & time 01/18/15  1823 History   First MD Initiated Contact with Patient 01/18/15 1829     Chief Complaint  Patient presents with  . Extremity Laceration     (Consider location/radiation/quality/duration/timing/severity/associated sxs/prior Treatment) The history is provided by the patient.   Nicholas Schmidt is a 44 y.o.right handed male presenting with laceration to his left palm occuring just prior to arrival.  He was using a kitchen knife when the injury occurred.  He has applied pressure to the wound with adequate hemostasis.  He denies weakness or numbness distal to the injury site.  He denies other complaint. He does not know the date of his last tetanus vaccine.     Past Medical History  Diagnosis Date  . TIA (transient ischemic attack)    History reviewed. No pertinent past surgical history. History reviewed. No pertinent family history. History  Substance Use Topics  . Smoking status: Current Every Day Smoker -- 0.50 packs/day    Types: Cigarettes  . Smokeless tobacco: Not on file  . Alcohol Use: Yes     Comment: occasionally    Review of Systems  Constitutional: Negative for fever and chills.  Respiratory: Negative for shortness of breath and wheezing.   Skin: Positive for wound.  Neurological: Negative for numbness.      Allergies  Bee venom  Home Medications   Prior to Admission medications   Medication Sig Start Date End Date Taking? Authorizing Provider  aspirin 325 MG tablet Take 1 tablet (325 mg total) by mouth daily. Patient not taking: Reported on 11/20/2014 08/29/12   Christiane Ha, MD  dicyclomine (BENTYL) 20 MG tablet Take 1 tablet (20 mg total) by mouth 2 (two) times daily. Patient not taking: Reported on 01/18/2015 11/20/14   Gilda Crease, MD  diphenoxylate-atropine (LOMOTIL) 2.5-0.025 MG per tablet Take 1 tablet by mouth 4 (four) times daily as needed for diarrhea or loose stools. Patient not taking:  Reported on 01/18/2015 11/20/14   Gilda Crease, MD  promethazine (PHENERGAN) 25 MG tablet Take 1 tablet (25 mg total) by mouth every 6 (six) hours as needed for nausea or vomiting. Patient not taking: Reported on 01/18/2015 11/20/14   Gilda Crease, MD  traMADol (ULTRAM) 50 MG tablet Take 1 tablet (50 mg total) by mouth every 6 (six) hours as needed. 01/18/15   Burgess Amor, PA-C   BP 140/72 mmHg  Pulse 72  Temp(Src) 98.6 F (37 C) (Oral)  Resp 18  Ht  (1.905 m)  Wt 210 lb (95.255 kg)  BMI 26.25 kg/m2  SpO2 100% Physical Exam  Constitutional: He is oriented to person, place, and time. He appears well-developed and well-nourished.  HENT:  Head: Normocephalic.  Cardiovascular: Normal rate.   Pulmonary/Chest: Effort normal.  Musculoskeletal: He exhibits no tenderness.       Hands: 3 cm linear laceration left thenar eminence, subcutaneous, hemostatic.  No deeper structure involvement.  Neurological: He is alert and oriented to person, place, and time. No sensory deficit.  No numbness distal to the injury site.    Skin: Laceration noted.    ED Course  Procedures (including critical care time)  LACERATION REPAIR Performed by: Burgess Amor Authorized by: Burgess Amor Consent: Verbal consent obtained. Risks and benefits: risks, benefits and alternatives were discussed Consent given by: patient Patient identity confirmed: provided demographic data Prepped and Draped in normal sterile fashion Wound explored  Laceration Location: left hand thenar eminence  Laceration Length: 3cm  No Foreign Bodies seen or palpated  Anesthesia: local infiltration  Local anesthetic: lidocaine 2% with epinephrine  Anesthetic total: 2 ml  Irrigation method: syringe Amount of cleaning: copious Skin closure: ethilon 4-0  Number of sutures: 6  Technique: simple interupted  Patient tolerance: Patient tolerated the procedure well with no immediate complications.  Labs  Review Labs Reviewed - No data to display  Imaging Review No results found.   EKG Interpretation None      MDM   Final diagnoses:  Hand laceration, left, initial encounter    Wound care instructions given.  Pt advised to have sutures removed in 10 days,  Return here sooner for any signs of infection including redness, swelling, worse pain or drainage of pus. Dressing applied.  Tetanus updated.       Burgess Amor, PA-C 01/19/15 0155  Eber Hong, MD 01/19/15 [phone]

## 2015-01-18 NOTE — ED Notes (Signed)
Pt reports cut left palm with kitchen knife. Minimal bleeding noted to site.

## 2015-08-30 ENCOUNTER — Observation Stay (HOSPITAL_COMMUNITY): Payer: BLUE CROSS/BLUE SHIELD

## 2015-08-30 ENCOUNTER — Observation Stay (HOSPITAL_BASED_OUTPATIENT_CLINIC_OR_DEPARTMENT_OTHER): Payer: BLUE CROSS/BLUE SHIELD

## 2015-08-30 ENCOUNTER — Emergency Department (HOSPITAL_COMMUNITY): Payer: BLUE CROSS/BLUE SHIELD

## 2015-08-30 ENCOUNTER — Encounter (HOSPITAL_COMMUNITY): Payer: Self-pay

## 2015-08-30 ENCOUNTER — Observation Stay (HOSPITAL_COMMUNITY)
Admission: EM | Admit: 2015-08-30 | Discharge: 2015-08-31 | Disposition: A | Discharge status: Home / Self Care | Payer: BLUE CROSS/BLUE SHIELD | Attending: Internal Medicine | Admitting: Internal Medicine

## 2015-08-30 DIAGNOSIS — F1910 Other psychoactive substance abuse, uncomplicated: Secondary | ICD-10-CM | POA: Diagnosis present

## 2015-08-30 DIAGNOSIS — R202 Paresthesia of skin: Secondary | ICD-10-CM | POA: Diagnosis present

## 2015-08-30 DIAGNOSIS — Z7982 Long term (current) use of aspirin: Secondary | ICD-10-CM | POA: Diagnosis not present

## 2015-08-30 DIAGNOSIS — Z720 Tobacco use: Secondary | ICD-10-CM | POA: Diagnosis not present

## 2015-08-30 DIAGNOSIS — I639 Cerebral infarction, unspecified: Principal | ICD-10-CM | POA: Insufficient documentation

## 2015-08-30 DIAGNOSIS — I6389 Other cerebral infarction: Secondary | ICD-10-CM

## 2015-08-30 DIAGNOSIS — R200 Anesthesia of skin: Secondary | ICD-10-CM

## 2015-08-30 DIAGNOSIS — G459 Transient cerebral ischemic attack, unspecified: Secondary | ICD-10-CM

## 2015-08-30 DIAGNOSIS — F17210 Nicotine dependence, cigarettes, uncomplicated: Secondary | ICD-10-CM | POA: Insufficient documentation

## 2015-08-30 LAB — CBC WITH DIFFERENTIAL/PLATELET
Basophils Absolute: 0 10*3/uL (ref 0.0–0.1)
Basophils Relative: 0 %
Eosinophils Absolute: 0.1 10*3/uL (ref 0.0–0.7)
Eosinophils Relative: 2 %
HEMATOCRIT: 45 % (ref 39.0–52.0)
HEMOGLOBIN: 15.2 g/dL (ref 13.0–17.0)
LYMPHS ABS: 3.6 10*3/uL (ref 0.7–4.0)
LYMPHS PCT: 42 %
MCH: 34.6 pg — AB (ref 26.0–34.0)
MCHC: 33.8 g/dL (ref 30.0–36.0)
MCV: 102.5 fL — AB (ref 78.0–100.0)
Monocytes Absolute: 0.6 10*3/uL (ref 0.1–1.0)
Monocytes Relative: 7 %
NEUTROS PCT: 49 %
Neutro Abs: 4.3 10*3/uL (ref 1.7–7.7)
PLATELETS: 168 10*3/uL (ref 150–400)
RBC: 4.39 MIL/uL (ref 4.22–5.81)
RDW: 12.8 % (ref 11.5–15.5)
WBC: 8.6 10*3/uL (ref 4.0–10.5)

## 2015-08-30 LAB — BASIC METABOLIC PANEL
Anion gap: 6 (ref 5–15)
BUN: 9 mg/dL (ref 6–20)
CHLORIDE: 106 mmol/L (ref 101–111)
CO2: 29 mmol/L (ref 22–32)
Calcium: 9.6 mg/dL (ref 8.9–10.3)
Creatinine, Ser: 1.11 mg/dL (ref 0.61–1.24)
GFR calc non Af Amer: >60 mL/min (ref >60)
Glucose, Bld: 92 mg/dL (ref 65–99)
POTASSIUM: 4.8 mmol/L (ref 3.5–5.1)
SODIUM: 141 mmol/L (ref 135–145)

## 2015-08-30 LAB — LIPID PANEL
CHOL/HDL RATIO: 4.1 ratio
CHOLESTEROL: 152 mg/dL (ref 0–200)
HDL: 37 mg/dL — ABNORMAL LOW (ref >40)
LDL Cholesterol: 91 mg/dL (ref 0–99)
TRIGLYCERIDES: 118 mg/dL (ref <150)
VLDL: 24 mg/dL (ref 0–40)

## 2015-08-30 LAB — VITAMIN B12: VITAMIN B 12: 614 pg/mL (ref 180–914)

## 2015-08-30 LAB — TSH: TSH: 0.577 u[IU]/mL (ref 0.350–4.500)

## 2015-08-30 MED ORDER — ASPIRIN 81 MG PO CHEW
324.0000 mg | CHEWABLE_TABLET | Freq: Once | ORAL | Status: AC
  Administered 2015-08-30: 324 mg via ORAL
  Filled 2015-08-30: qty 4

## 2015-08-30 MED ORDER — SENNOSIDES-DOCUSATE SODIUM 8.6-50 MG PO TABS
1.0000 | ORAL_TABLET | Freq: Every evening | ORAL | Status: DC | PRN
  Filled 2015-08-30: qty 1

## 2015-08-30 MED ORDER — ENOXAPARIN SODIUM 40 MG/0.4ML ~~LOC~~ SOLN
40.0000 mg | SUBCUTANEOUS | Status: DC
  Administered 2015-08-31: 40 mg via SUBCUTANEOUS
  Filled 2015-08-30: qty 0.4

## 2015-08-30 MED ORDER — ENOXAPARIN SODIUM 40 MG/0.4ML ~~LOC~~ SOLN
40.0000 mg | SUBCUTANEOUS | Status: DC
  Administered 2015-08-30: 40 mg via SUBCUTANEOUS
  Filled 2015-08-30: qty 0.4

## 2015-08-30 MED ORDER — ASPIRIN 325 MG PO TABS
325.0000 mg | ORAL_TABLET | Freq: Every day | ORAL | Status: DC
  Administered 2015-08-31: 325 mg via ORAL
  Filled 2015-08-30: qty 1

## 2015-08-30 MED ORDER — NICOTINE 21 MG/24HR TD PT24
21.0000 mg | MEDICATED_PATCH | Freq: Every day | TRANSDERMAL | Status: DC
  Filled 2015-08-30 (×2): qty 1

## 2015-08-30 MED ORDER — ASPIRIN 325 MG PO TABS: 325.0000 mg | ORAL_TABLET | Freq: Every day | ORAL | Status: DC

## 2015-08-30 MED ORDER — STROKE: EARLY STAGES OF RECOVERY BOOK
Freq: Once | Status: AC
  Administered 2015-08-30: 10:00:00
  Filled 2015-08-30: qty 1

## 2015-08-30 NOTE — ED Notes (Signed)
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 Denies hitting his head, injury to shoulder, or LOC. Pt stated "lightheaded" when he walked to his supervisor and the "spots" stopped. Now states weakness to left arm and "tingling"

## 2015-08-30 NOTE — H&P (Signed)
[facility] History and Physical  Nicholas Schmidt ZOX:[id number] DOB: December 12, 1970    PCP:   [name], MD   Chief Complaint: left arm numbness.   HPI: Nicholas Schmidt is an 45 y.o. male with hx of prior TIA about 3 years ago, with negative work up including MRA/MRI/doppler of carotids (did show non obtructive plagues and vessel irregularity), HTN, hx of significant tobacco and cocaine use, presented to the ER with sudden onset of left upper arm paresthesia.   He has no HA, N/V slurred speech or lower extremity weakness or numbness.  Work up included a negative head CT, and EKG showed NSR.  Serology was unremarkable, except MCV was 102.  He denied significant alcohol use.  Teleneurology consultation was done, and TPA was not recommended due to mild symptomology.  Hospitalist was asked to admit him for TIA/CVA work up.   He was previously on ASA, but he stopped.   Rewiew of Systems:  Constitutional: Negative for malaise, fever and chills. No significant weight loss or weight gain Eyes: Negative for eye pain, redness and discharge, diplopia, visual changes, or flashes of light. ENMT: Negative for ear pain, hoarseness, nasal congestion, sinus pressure and sore throat. No headaches; tinnitus, drooling, or problem swallowing. Cardiovascular: Negative for chest pain, palpitations, diaphoresis, dyspnea and peripheral edema. ; No orthopnea, PND Respiratory: Negative for cough, hemoptysis, wheezing and stridor. No pleuritic chestpain. Gastrointestinal: Negative for nausea, vomiting, diarrhea, constipation, abdominal pain, melena, blood in stool, hematemesis, jaundice and rectal bleeding.    Genitourinary: Negative for frequency, dysuria, incontinence,flank pain and hematuria; Musculoskeletal: Negative for back pain and neck pain. Negative for swelling and trauma.;  Skin: . Negative for pruritus, rash, abrasions, bruising and skin lesion.; ulcerations Neuro: Negative for headache,  lightheadedness and neck stiffness. Negative for weakness, altered level of consciousness , altered mental status, extremity weakness, burning feet, involuntary movement, seizure and syncope.  Psych: negative for anxiety, depression, insomnia, tearfulness, panic attacks, hallucinations, paranoia, suicidal or homicidal ideation    Past Medical History  Diagnosis Date  . TIA (transient ischemic attack)     History reviewed. No pertinent past surgical history.  Medications:  HOME MEDS: Prior to Admission medications   Medication Sig Start Date End Date Taking? Authorizing Provider  aspirin 325 MG tablet Take 1 tablet (325 mg total) by mouth daily. Patient not taking: Reported on 11/20/2014 08/29/12   Christiane Ha, MD     Allergies:  Allergies  Allergen Reactions  . Bee Venom     anaphylaxis    Social History:   reports that he has been smoking Cigarettes.  He has been smoking about 0.50 packs per day. He does not have any smokeless tobacco history on file. He reports that he drinks alcohol. He reports that he does not use illicit drugs.  Family History: History reviewed. No pertinent family history.   Physical Exam: Filed Vitals:   08/30/15 0115 08/30/15 0158 08/30/15 0205 08/30/15 0300  BP: 125/98  143/88 141/85  Pulse: 82  74 73  Temp:  98 F (36.7 C)    TempSrc:      Resp: Height:      Weight:      SpO2: 98%  99% 96%   Blood pressure 141/85, pulse 73, temperature 98 F (36.7 C), temperature source Oral, resp. rate 19, height  (1.905 m), weight 95.255 kg (210 lb), SpO2 96 %.  GEN:  Pleasant  patient lying in the stretcher in  no acute distress; cooperative with exam. PSYCH:  alert and oriented x4; does not appear anxious or depressed; affect is appropriate. HEENT: Mucous membranes pink and anicteric; PERRLA; EOM intact; no cervical lymphadenopathy nor thyromegaly or carotid bruit; no JVD; There were no stridor. Neck is very supple.  He has slight  visual field defect on the left field of the left eye only.  Breasts:: Not examined CHEST WALL: No tenderness CHEST: Normal respiration, clear to auscultation bilaterally.  HEART: Regular rate and rhythm.  There are no murmur, rub, or gallops.   BACK: No kyphosis or scoliosis; no CVA tenderness ABDOMEN: soft and non-tender; no masses, no organomegaly, normal abdominal bowel sounds; no pannus; no intertriginous candida. There is no rebound and no distention. Rectal Exam: Not done EXTREMITIES: No bone or joint deformity; age-appropriate arthropathy of the hands and knees; no edema; no ulcerations.  There is no calf tenderness. Genitalia: not examined PULSES: 2+ and symmetric SKIN: Normal hydration no rash or ulceration CNS: Cranial nerves 2-12 grossly intact no focal lateralizing neurologic deficit.  Speech is fluent; uvula elevated with phonation, facial symmetry and tongue midline. DTR are normal bilaterally, cerebella exam is intact, barbinski is negative and strengths.  Left upper extremity appears weak.    Labs on Admission:  Basic Metabolic Panel:  Recent Labs Lab 08/30/15 0051  NA 141  K 4.8  CL 106  CO2 29  GLUCOSE 92  BUN 9  CREATININE 1.11  CALCIUM 9.6   Liver Function Tests: CBC:  Recent Labs Lab 08/30/15 0051  WBC 8.6  NEUTROABS 4.3  HGB 15.2  HCT 45.0  MCV 102.5*  PLT 168    Radiological Exams on Admission: Ct Head Wo Contrast  08/30/2015  CLINICAL DATA:  Acute onset LEFT arm pain 3 hours prior to arrival associated with tingling and lightheadedness. History of TIA. EXAM: CT HEAD WITHOUT CONTRAST TECHNIQUE: Contiguous axial images were obtained from the base of the skull through the vertex without intravenous contrast. COMPARISON:  MRI of the head August 28, 2012 FINDINGS: The ventricles and sulci are normal. No intraparenchymal hemorrhage, mass effect nor midline shift. No acute large vascular territory infarcts. No abnormal extra-axial fluid collections. Basal  cisterns are patent. No skull fracture. The included ocular globes and orbital contents are non-suspicious. Small less sphenoid air-fluid level. The mastoid air cells are well aerated. IMPRESSION: Negative CT head. Electronically Signed   By: Awilda Metro M.D.   On: 08/30/2015 01:20    EKG: Independently reviewed.    Assessment/Plan Present on Admission:  . TIA (transient ischemic attack) . Tobacco abuse . Polysubstance abuse  PLAN:    I am not certain this represent TIA, as it could be peripheral nerve neuropathy.  His left field exam is questionable as having field cut, but its only on his left eye.  Doubt this is complex migraine, although it is in the differential diagnosis.   Will proceed with MRI/MRA and doppler of carotid along with ECHO.  Will resume his ASA, and I stressed the importance of being compliant.  I would consult neurology as well.  Tobacco and Cocaine abuse:  Advised cessation.  Macrocytosis:   Will check RBC folate, along with B12.    Other plans as per orders. Code Status: FULL Unk Lightning, MD. FACP [facility] Pager [phone] 7pm to 7am.  08/30/2015, 3:26 AM

## 2015-08-30 NOTE — ED Provider Notes (Signed)
CSN: [id number]     Arrival date & time 08/30/15  0011 History  By signing my name below, I, Nicholas Schmidt, attest that this documentation has been prepared under the direction and in the presence of Zadie Rhine, MD. Electronically Signed: Linus Schmidt, ED Scribe. 08/30/2015. 12:43 AM.   Chief Complaint  Patient presents with  . Arm Pain   Patient is a 45 y.o. male presenting with arm injury. The history is provided by the patient. No language interpreter was used.  Arm Injury Location:  Arm Time since incident:  3 hours Arm location:  L arm Pain details:    Radiates to:  Does not radiate   Severity:  Mild   Onset quality:  Sudden   Timing:  Constant   Progression:  Unchanged Chronicity:  New Dislocation: no   Foreign body present:  No foreign bodies Prior injury to area:  No Relieved by:  Nothing Worsened by:  Movement Ineffective treatments:  None tried Associated symptoms: tingling   Associated symptoms: no fever and no neck pain   HPI Comments: Nicholas Schmidt is a 45 y.o. male who presents to the Emergency Department with a PMHx of TIA complaining of sudden left arm pain, 3 hours, PTA. Pt reports he has pain with ROM. He also reports tingling in his left hand, lightheadedness, and sees "dots" in his vision. 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 He was concerned for a possible stroke so he came to the ED for further evaluation. Pt denies any fevers, vomiting, HA, vision changes, CP, SOB facial weakness, slurred speech, facial numbness, abdominal pain, numbness in his leg or any other symptoms at this time.   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   Past Medical History  Diagnosis Date  . TIA (transient ischemic attack)    History reviewed. No pertinent past surgical history. No family history on file. Social History  Substance Use Topics  .  Smoking status: Current Every Day Smoker -- 0.50 packs/day    Types: Cigarettes  . Smokeless tobacco: None  . Alcohol Use: Yes     Comment: occasionally    Review of Systems  Constitutional: Negative for fever.  Eyes: Positive for visual disturbance.  Respiratory: Negative for shortness of breath.   Cardiovascular: Negative for chest pain.  Gastrointestinal: Negative for vomiting and abdominal pain.  Musculoskeletal: Negative for neck pain.  Neurological: Positive for light-headedness and numbness. Negative for facial asymmetry, speech difficulty, weakness and headaches.  All other systems reviewed and are negative.  Allergies  Bee venom  Home Medications   Prior to Admission medications   Medication Sig Start Date End Date Taking? Authorizing Provider  aspirin 325 MG tablet Take 1 tablet (325 mg total) by mouth daily. Patient not taking: Reported on 11/20/2014 08/29/12   Christiane Ha, MD   BP 142/88 mmHg  Pulse 78  Temp(Src) 98.1 F (36.7 C) (Oral)  Resp 16  Ht  (1.905 m)  Wt 210 lb (95.255 kg)  BMI 26.25 kg/m2  SpO2 100%   Physical Exam   CONSTITUTIONAL: Well developed/well nourished HEAD: Normocephalic/atraumatic EYES: EOMI/PERRL, no nystagmus,  no ptosis ENMT: Mucous membranes moist NECK: supple no meningeal signs, no bruits CV: S1/S2 noted, no murmurs/rubs/gallops noted LUNGS: Lungs are clear to auscultation bilaterally, no apparent distress ABDOMEN: soft, nontender, no rebound or guarding GU:no cva tenderness NEURO:Awake/alert,  face symmetric, no arm or leg drift is noted Mild decrease in strength with left hand grip.  Mild weakness with flexion of left elbow.  He has numbness along medial aspect of left UE from bicep to hand.  There is no sensory deficit on lateral aspect of arm.  No wrist drop.  No weakness with shoulder abduction on left UE. Equal 5/5 strength with hip flexion,knee flex/extension, foot dorsi/plantar flexion Cranial nerves  3/4/5/6/01/05/09/11/12 tested and intact Gait normal without ataxia EXTREMITIES: pulses normal, full ROM, no bruising/erythema to left UE. Tenderness to palpation of left UE SKIN: warm, color normal PSYCH: no abnormalities of mood noted  ED Course  Procedures   DIAGNOSTIC STUDIES: Oxygen Saturation is 100% on room air, normal by my interpretation.    COORDINATION OF CARE: 12:26 AMWill order blood work and CT head. Discussed treatment plan with pt at bedside and pt agreed to plan. Pt here with left UE numbness and pain.  He has mild decrease in left hand grip.  No other signs of CVA on his exam.  Unclear if this is central cause or a peripheral neuropathy.  He does have h/o TIA per chart.  Plan to consult tele-neuro and perform CT head.   2:23 AM Seen by teleneurology They recommend admission for stroke workup Pt agreeable D/w dr Conley Rolls for admission  tPA in stroke considered but not given due to: Rapid improvement/severity mild TPA not recommended by tele-neurology  Labs Review Labs Reviewed  CBC WITH DIFFERENTIAL/PLATELET - Abnormal; Notable for the following:    MCV 102.5 (*)    MCH 34.6 (*)    All other components within normal limits  BASIC METABOLIC PANEL    Imaging Review Ct Head Wo Contrast  08/30/2015  CLINICAL DATA:  Acute onset LEFT arm pain 3 hours prior to arrival associated with tingling and lightheadedness. History of TIA. EXAM: CT HEAD WITHOUT CONTRAST TECHNIQUE: Contiguous axial images were obtained from the base of the skull through the vertex without intravenous contrast. COMPARISON:  MRI of the head August 28, 2012 FINDINGS: The ventricles and sulci are normal. No intraparenchymal hemorrhage, mass effect nor midline shift. No acute large vascular territory infarcts. No abnormal extra-axial fluid collections. Basal cisterns are patent. No skull fracture. The included ocular globes and orbital contents are non-suspicious. Small less sphenoid air-fluid level. The mastoid  air cells are well aerated. IMPRESSION: Negative CT head. Electronically Signed   By: Awilda Metro M.D.   On: 08/30/2015 01:20   I have personally reviewed and evaluated these lab results as part of my medical decision-making.   EKG Interpretation   Date/Time:  Thursday August 30 2015 00:49:19 EST Ventricular Rate:  79 PR Interval:  146 QRS Duration: 112 QT Interval:  393 QTC Calculation: 450 R Axis:   58 Text Interpretation:  Sinus rhythm Incomplete right bundle branch block ST  elev, probable normal early repol pattern Baseline wander in lead(s) V3 No  significant change since last tracing Confirmed by Bebe Shaggy  MD, Candiace West  [phone]) on 08/30/2015 1:03:14 AM      MDM   Final diagnoses:  Left arm numbness  Cerebrovascular accident (CVA) due to other mechanism [facility])    I personally performed the services described in this documentation, which was scribed in my presence. The recorded information has been reviewed and is accurate.     I personally performed the services described in this documentation, which was scribed in my presence. The recorded information has been reviewed and is accurate.  Zadie Rhine, MD 08/30/15 [phone]

## 2015-08-30 NOTE — Progress Notes (Signed)
SLP Cancellation Note  Patient Details Name: Nicholas Schmidt MRN: [id number] DOB: 10/11/1970   Cancelled treatment:        MRI revealed "No acute infarct, hemorrhage, or mass lesion is present." Pt cognitively at baseline; no further ST needs identified at this time.  Dayle Sherpa H. Romie Levee, CCC-SLP Speech Language Pathologist     Georgetta Haber 08/30/2015, 3:01 PM

## 2015-08-30 NOTE — ED Notes (Signed)
Tele-Neuro completed at this time

## 2015-08-30 NOTE — Progress Notes (Addendum)
Patient seen and examined, reported left eye blurry, left hand with decreased grip , numbness has resolved. Echo pending, neurology consulted. Per patient has had one episode of left sided numbness and weakness in 2014 was put on asa, then he self discontinued it. Consider outpatient holter monitor to r/o underline arryhthmia.   Smoker a pack a day, states he is ready to quit.

## 2015-08-30 NOTE — ED Notes (Signed)
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  Pt states since then he also has some tingling to his left fingertips.  Pt states he has pain with movement of his left arm.

## 2015-08-30 NOTE — Consult Note (Signed)
[place] A. Merlene Laughter, MD     [email or web address]          Nicholas Schmidt is an 45 y.o. male.   ASSESSMENT/PLAN: MRI negative focal neurological symptoms involving the left upper extremity and associated with visual symptoms. 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 Additionally, partial seizures is also a possibility. It is less likely ischemic in nature. The patient does have a history of cocaine use which could complicate the diagnostic picture however.  RECOMMENDATION: EEG. Urine toxicology/urine drug screen. Additional labs for the following: RPR, HIV, vitamin B12, homocysteine and TSH.  The patient is a 45 year old black male who presents with acute onset of tingling, numbness and weakness of the left upper extremity. He reports mostly that he has numbness of the left forearm but more distally has tingling. He also reported weakness is mostly concentrated to the left hand. The patient indicates that his symptoms lasted several hours but has since resolved. He had a similar event a couple years ago and the palate was diagnosed as having a TIA. The current event was also associated with abnormal floaters and dots involving the vision on the left side. The patient also reports having fluttering of the left eyelid. The previous event was similar to this event but involves also the left lower extremity. The patient does not report having neck pain or headaches. He does not report having episodic headaches. There is no clear loss of consciousness or alteration of consciousness. No chest pain, shows of breath, dysarthria or dysphagia he does report having some dizziness. The review of systems otherwise negative.  GENERAL: Pleasant in no acute distress.  HEENT: Supple. Atraumatic normocephalic.   ABDOMEN: soft  EXTREMITIES: No edema   BACK: Normal.  SKIN:  Normal by inspection.    MENTAL STATUS: Alert and oriented. Speech, language and cognition are generally intact. Judgment and insight normal.   CRANIAL NERVES: Pupils are equal, round and reactive to light and accommodation; extra ocular movements are full, there is no significant nystagmus; visual fields are full; upper and lower facial muscles are normal in strength and symmetric, there is no flattening of the nasolabial folds; tongue is midline; uvula is midline; shoulder elevation is normal.  MOTOR: Normal tone, bulk and strength; no pronator drift.  COORDINATION: Left finger to nose is normal, right finger to nose is normal, No rest tremor; no intention tremor; no postural tremor; no bradykinesia.  REFLEXES: Deep tendon reflexes are symmetrical and normal. Babinski reflexes are flexor bilaterally.   SENSATION: Normal to light touch.   TTE - Left ventricle: The cavity size was normal. Wall thickness was   normal. Systolic function was normal. The estimated ejection   fraction was in the range of 60% to 65%. Wall motion was normal;   there were no regional wall motion abnormalities. Left   ventricular diastolic function parameters were normal. - Aortic valve: Valve area (VTI): 2.55 cm^2. Valve area (Vmax):   2.92 cm^2. - Atrial septum: No defect or patent foramen ovale was identified.  CAROTID DOPP NORMAL  BRAIN MRI/MRA NORMAL     Blood pressure 122/64, pulse 74, temperature 98 F (36.7 C), temperature source Oral, resp. rate 20, height 6' 3"  (1.905 m), weight 95.255 kg (210 lb), SpO2 99 %.  Past Medical History  Diagnosis Date  . TIA (transient ischemic attack)     History reviewed. No pertinent past surgical history.  History reviewed. No pertinent family history.  Social History:  reports that he has been smoking Cigarettes.  He has been smoking about 1.00 pack per day. He does not have any smokeless tobacco history on file. He reports that he drinks alcohol. He  reports that he uses illicit drugs (Cocaine).  Allergies:  Allergies  Allergen Reactions  . Bee Venom     anaphylaxis    Medications: Prior to Admission medications   Not on File    Scheduled Meds: . [START ON 08/31/2015] aspirin  325 mg Oral Daily  . [START ON 08/31/2015] enoxaparin (LOVENOX) injection  40 mg Subcutaneous Q24H  . nicotine  21 mg Transdermal Daily   Continuous Infusions:  PRN Meds:.senna-docusate     Results for orders placed or performed during the hospital encounter of 08/30/15 (from the past 48 hour(s))  Basic metabolic panel     Status: None   Collection Time: 08/30/15 12:51 AM  Result Value Ref Range   Sodium 141 135 - 145 mmol/L   Potassium 4.8 3.5 - 5.1 mmol/L   Chloride 106 101 - 111 mmol/L   CO2 29 22 - 32 mmol/L   Glucose, Bld 92 65 - 99 mg/dL   BUN 9 6 - 20 mg/dL   Creatinine, Ser 1.11 0.61 - 1.24 mg/dL   Calcium 9.6 8.9 - 10.3 mg/dL   GFR calc non Af Amer >60 >60 mL/min   GFR calc Af Amer >60 >60 mL/min    Comment: (NOTE) The eGFR has been calculated using the CKD EPI equation. This calculation has not been validated in all clinical situations. eGFR's persistently <60 mL/min signify possible Chronic Kidney Disease.    Anion gap 6 5 - 15  CBC with Differential/Platelet     Status: Abnormal   Collection Time: 08/30/15 12:51 AM  Result Value Ref Range   WBC 8.6 4.0 - 10.5 K/uL   RBC 4.39 4.22 - 5.81 MIL/uL   Hemoglobin 15.2 13.0 - 17.0 g/dL   HCT 45.0 39.0 - 52.0 %   MCV 102.5 (H) 78.0 - 100.0 fL   MCH 34.6 (H) 26.0 - 34.0 pg   MCHC 33.8 30.0 - 36.0 g/dL   RDW 12.8 11.5 - 15.5 %   Platelets 168 150 - 400 K/uL   Neutrophils Relative % 49 %   Neutro Abs 4.3 1.7 - 7.7 K/uL   Lymphocytes Relative 42 %   Lymphs Abs 3.6 0.7 - 4.0 K/uL   Monocytes Relative 7 %   Monocytes Absolute 0.6 0.1 - 1.0 K/uL   Eosinophils Relative 2 %   Eosinophils Absolute 0.1 0.0 - 0.7 K/uL   Basophils Relative 0 %   Basophils Absolute 0.0 0.0 - 0.1 K/uL    Vitamin B12     Status: None   Collection Time: 08/30/15 12:51 AM  Result Value Ref Range   Vitamin B-12 614 180 - 914 pg/mL    Comment: (NOTE) This assay is not validated for testing neonatal or myeloproliferative syndrome specimens for Vitamin B12 levels. Performed at [facility]   Lipid panel     Status: Abnormal   Collection Time: 08/30/15  5:30 AM  Result Value Ref Range   Cholesterol 152 0 - 200 mg/dL   Triglycerides 118 <150 mg/dL   HDL 37 (L) >40 mg/dL   Total CHOL/HDL Ratio 4.1 RATIO   VLDL 24 0 - 40 mg/dL   LDL Cholesterol 91 0 - 99 mg/dL    Comment:  Total Cholesterol/HDL:CHD Risk Coronary Heart Disease Risk Table                     Men   Women  1/2 Average Risk   3.4   3.3  Average Risk       5.0   4.4  2 X Average Risk   9.6   7.1  3 X Average Risk  23.4   11.0        Use the calculated Patient Ratio above and the CHD Risk Table to determine the patient's CHD Risk.        ATP III CLASSIFICATION (LDL):  <100     mg/dL   Optimal  100-129  mg/dL   Near or Above                    Optimal  130-159  mg/dL   Borderline  160-189  mg/dL   High  >190     mg/dL   Very High   TSH     Status: None   Collection Time: 08/30/15  9:17 PM  Result Value Ref Range   TSH 0.577 0.350 - 4.500 uIU/mL    Studies/Results:     Idania Desouza A. Merlene Laughter, M.D.  Diplomate, Tax adviser of Psychiatry and Neurology ( Neurology). 08/30/2015, 11:02 PM

## 2015-08-30 NOTE — ED Notes (Signed)
Echocardigram in progress

## 2015-08-30 NOTE — ED Notes (Signed)
Tele-neuro is at bedside.

## 2015-08-31 ENCOUNTER — Observation Stay (HOSPITAL_COMMUNITY)
Admit: 2015-08-31 | Discharge: 2015-08-31 | Disposition: A | Discharge status: Home / Self Care | Payer: BLUE CROSS/BLUE SHIELD | Attending: Neurology | Admitting: Neurology

## 2015-08-31 DIAGNOSIS — Z720 Tobacco use: Secondary | ICD-10-CM

## 2015-08-31 DIAGNOSIS — F1910 Other psychoactive substance abuse, uncomplicated: Secondary | ICD-10-CM | POA: Diagnosis not present

## 2015-08-31 DIAGNOSIS — R208 Other disturbances of skin sensation: Secondary | ICD-10-CM | POA: Diagnosis not present

## 2015-08-31 DIAGNOSIS — G459 Transient cerebral ischemic attack, unspecified: Secondary | ICD-10-CM | POA: Diagnosis not present

## 2015-08-31 LAB — RAPID URINE DRUG SCREEN, HOSP PERFORMED
Amphetamines: NOT DETECTED
BARBITURATES: NOT DETECTED
Benzodiazepines: NOT DETECTED
COCAINE: POSITIVE — AB
Opiates: NOT DETECTED
TETRAHYDROCANNABINOL: NOT DETECTED

## 2015-08-31 LAB — FOLATE RBC
FOLATE, HEMOLYSATE: 403.6 ng/mL
FOLATE, RBC: 966 ng/mL (ref >498)
Hematocrit: 41.8 % (ref 37.5–51.0)

## 2015-08-31 LAB — HEMOGLOBIN A1C
Hgb A1c MFr Bld: 4.7 % — ABNORMAL LOW (ref 4.8–5.6)
Mean Plasma Glucose: 88 mg/dL

## 2015-08-31 MED ORDER — ATORVASTATIN CALCIUM 10 MG PO TABS: 10.0000 mg | ORAL_TABLET | Freq: Every day | ORAL | Status: DC

## 2015-08-31 MED ORDER — ATORVASTATIN CALCIUM 10 MG PO TABS
10.0000 mg | ORAL_TABLET | Freq: Every day | ORAL | Status: DC
  Administered 2015-08-31: 10 mg via ORAL
  Filled 2015-08-31: qty 1

## 2015-08-31 MED ORDER — ASPIRIN 325 MG PO TABS: 325.0000 mg | ORAL_TABLET | Freq: Every day | ORAL | Status: DC

## 2015-08-31 MED ORDER — NICOTINE 21 MG/24HR TD PT24: 21.0000 mg | MEDICATED_PATCH | Freq: Every day | TRANSDERMAL | Status: DC

## 2015-08-31 NOTE — Progress Notes (Signed)
PT Cancellation Note  Patient Details Name: Nicholas Schmidt MRN: [id number] DOB: 02/13/1971   Cancelled Treatment:    Reason Eval/Treat Not Completed: PT screened, no needs identified, will sign off. Conversed with patient who denies any involvement of LE weakness, balance, coordination, sensory, or other deficit. Neuro and OT notes describe full resolution of symptoms since initial ictus, imagine study negative for acute infarct. Pt is agreeable that he has returned to baseline and has no questions at this time. PT signing off.   12:01 PM, 08/31/2015 Rosamaria LintsAllan C Ayo Smoak, PT, DPT PRN [facility] License # [id number] [phone] (wireless)  [phone] (mobile)

## 2015-08-31 NOTE — Progress Notes (Signed)
Patient with orders to be discharge home. Discharge instructions given, patient verbalized understanding. Prescriptions given. Patient stable. Patient left in private vehicle with friend.  

## 2015-08-31 NOTE — Discharge Summary (Signed)
Physician Discharge Summary   Patient ID: Nicholas Schmidt MRN: [id number] DOB/AGE: 1970-10-05 45 y.o.  Admit date: 08/30/2015 Discharge date: 08/31/2015  Primary Care Physician:  [name], MD  Discharge Diagnoses:    . TIA (transient ischemic attack) . Tobacco abuse . Polysubstance abuse   Noncompliant  Consults:  Neurology  Recommendations for Outpatient Follow-up:  1. Please repeat CBC/BMET at next visit  DIET: Heart healthy diet    Allergies:   Allergies  Allergen Reactions  . Bee Venom     anaphylaxis     DISCHARGE MEDICATIONS: Current Discharge Medication List    START taking these medications   Details  atorvastatin (LIPITOR) 10 MG tablet Take 1 tablet (10 mg total) by mouth at bedtime. Qty: 30 tablet, Refills: 4    nicotine (NICODERM CQ - DOSED IN MG/24 HOURS) 21 mg/24hr patch Place 1 patch (21 mg total) onto the skin daily. Qty: 28 patch, Refills: 0      CONTINUE these medications which have CHANGED   Details  aspirin 325 MG tablet Take 1 tablet (325 mg total) by mouth daily. Qty: 30 tablet, Refills: 4      STOP taking these medications     dicyclomine (BENTYL) 20 MG tablet      diphenoxylate-atropine (LOMOTIL) 2.5-0.025 MG per tablet      promethazine (PHENERGAN) 25 MG tablet      traMADol (ULTRAM) 50 MG tablet          Brief H and P: For complete details please refer to admission H and P, but in brief Nicholas Schmidt is an 45 y.o. male with hx of prior TIA about 3 years ago, with negative work up including MRA/MRI/doppler of carotids (did show non obtructive plagues and vessel irregularity), HTN, hx of significant tobacco and cocaine use, presented to the ER with sudden onset of left upper arm paresthesia. He has no HA, N/V slurred speech or lower extremity weakness or numbness. Work up included a negative head CT, and EKG showed NSR. Serology was unremarkable, except MCV was 102. He denied significant alcohol use.  Teleneurology consultation was done, and TPA was not recommended due to mild symptomology. Hospitalist was asked to admit him for TIA/CVA work up. He was previously on ASA, but he stopped.   Hospital Course:  TIA (transient ischemic attack) presenting with sudden onset of left upper arm paresthesias, visual symptoms. Patient is noncompliant with aspirin,+ cocaine abuse. Per neurology, partial seizures is also a possibility or most likely complex migraine/migraine with aura. - Neurology was consulted. - MRI brain showed no acute infarct - MRA showed normal intracranial circulation  - 2-D echo showed EF 60-65%, no PFO - Carotid Dopplers showed no ICA stenosis involving the right ICA and left ICA - Neurology recommended continuing aspirin 325 mg daily - Lipid panel showed LDL 91 , goal less than 70, placed on statin - Hemoglobin A1c 4.7 - PT evaluation recommending no PT follow-up, patient ambulating in the hallways - Neurology, Dr. Gerilyn Pilgrim recommended EEG which was normal  - TSH 0.5, RPR, homocystine, HIV pending   Tobacco abuse - Patient counseled on nicotine cessation   Polysubstance abuse - UDS positive for cocaine, patient recommended to quit cocaine  Macrocytosis - Likely due to alcohol use, MCV 102, B12 614, folate 403   Day of Discharge BP 132/76 mmHg  Pulse 74  Temp(Src) 97.6 F (36.4 C) (Oral)  Resp 20  Ht  (1.905 m)  Wt 95.255 kg (210 lb)  BMI  26.25 kg/m2  SpO2 100%  Physical Exam: General: Alert and awake oriented x3 not in any acute distress. HEENT: anicteric sclera, pupils reactive to light and accommodation CVS: S1-S2 clear no murmur rubs or gallops Chest: clear to auscultation bilaterally, no wheezing rales or rhonchi Abdomen: soft nontender, nondistended, normal bowel sounds Extremities: no cyanosis, clubbing or edema noted bilaterally Neuro: Cranial nerves II-XII intact, no focal neurological deficits   The results of significant diagnostics  from this hospitalization (including imaging, microbiology, ancillary and laboratory) are listed below for reference.    LAB RESULTS: Basic Metabolic Panel:  Recent Labs Lab 08/30/15 0051  NA 141  K 4.8  CL 106  CO2 29  GLUCOSE 92  BUN 9  CREATININE 1.11  CALCIUM 9.6   Liver Function Tests: No results for input(s): AST, ALT, ALKPHOS, BILITOT, PROT, ALBUMIN in the last 168 hours. No results for input(s): LIPASE, AMYLASE in the last 168 hours. No results for input(s): AMMONIA in the last 168 hours. CBC:  Recent Labs Lab 08/30/15 0051 08/30/15 0530  WBC 8.6  --   NEUTROABS 4.3  --   HGB 15.2  --   HCT 45.0 41.8  MCV 102.5*  --   PLT 168  --    Cardiac Enzymes: No results for input(s): CKTOTAL, CKMB, CKMBINDEX, TROPONINI in the last 168 hours. BNP: Invalid input(s): POCBNP CBG: No results for input(s): GLUCAP in the last 168 hours.  Significant Diagnostic Studies:  Ct Head Wo Contrast  08/30/2015  CLINICAL DATA:  Acute onset LEFT arm pain 3 hours prior to arrival associated with tingling and lightheadedness. History of TIA. EXAM: CT HEAD WITHOUT CONTRAST TECHNIQUE: Contiguous axial images were obtained from the base of the skull through the vertex without intravenous contrast. COMPARISON:  MRI of the head August 28, 2012 FINDINGS: The ventricles and sulci are normal. No intraparenchymal hemorrhage, mass effect nor midline shift. No acute large vascular territory infarcts. No abnormal extra-axial fluid collections. Basal cisterns are patent. No skull fracture. The included ocular globes and orbital contents are non-suspicious. Small less sphenoid air-fluid level. The mastoid air cells are well aerated. IMPRESSION: Negative CT head. Electronically Signed   By: Awilda Metro M.D.   On: 08/30/2015 01:20   Mr [name] Head Wo Contrast  08/30/2015  CLINICAL DATA:  Left-sided weakness and dizziness for 1 day.  TIA. EXAM: MRA HEAD WITHOUT CONTRAST TECHNIQUE: Angiographic images of the  Circle of Willis were obtained using MRA technique without intravenous contrast. COMPARISON:  MRI brain from the same day.  MRA head 08/28/2012. FINDINGS: The study is moderately degraded by patient motion. Asymmetric caliber of the right internal carotid artery is again noted, likely related to the hypoplastic right A1 segment. The cavernous internal carotid arteries are displaced by patient motion. The left A1 segment is intact. Both A2 segments originate from the left with a patent anterior communicating artery. 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 The MCA branch vessels are intact bilaterally. The vertebral arteries are codominant. The vertebrobasilar junction is normal. Prominent AICA vessels are noted bilaterally. Both posterior cerebral arteries originate from the basilar tip. The PCA branch vessels are intact. IMPRESSION: 1. No significant interval change. 2. The study is moderately degraded by patient motion. 3. Distal small vessel disease is suspected, but specificity is degraded by patient motion. Electronically Signed   By: Marin Roberts M.D.   On: 08/30/2015 08:39   Mr Brain Wo Contrast  08/30/2015  CLINICAL  DATA:  Left-sided weakness and dizziness for 1 day.  TIA. EXAM: MRI HEAD WITHOUT CONTRAST TECHNIQUE: Multiplanar, multiecho pulse sequences of the brain and surrounding structures were obtained without intravenous contrast. COMPARISON:  CT head without contrast 08/30/2015. MRI brain 08/28/2012. FINDINGS: No acute infarct, hemorrhage, or mass lesion is present. The ventricles are of normal size. No significant extraaxial fluid collection is present. No significant white matter disease is present. The internal auditory canals are within normal limits. Flow is present in the major intracranial arteries. The globes and orbits are intact. A fluid level is present in the left sphenoid sinus. The paranasal sinuses are otherwise clear. The mastoid air cells are  clear. Skullbase is within normal limits. Midline sagittal images are unremarkable. IMPRESSION: Negative MRI of the brain. Electronically Signed   By: Marin Roberts M.D.   On: 08/30/2015 08:42   US Carotid Bilateral  08/30/2015  CLINICAL DATA:  45 year old with a history of TIA. Cardiovascular risk factors include prior stroke/ TIA, tobacco use EXAM: BILATERAL CAROTID DUPLEX ULTRASOUND TECHNIQUE: Wallace Cullens scale imaging, color Doppler and duplex ultrasound were performed of bilateral carotid and vertebral arteries in the neck. COMPARISON:  No prior duplex FINDINGS: Criteria: Quantification of carotid stenosis is based on velocity parameters that correlate the residual internal carotid diameter with NASCET-based stenosis levels, using the diameter of the distal internal carotid lumen as the denominator for stenosis measurement. The following velocity measurements were obtained: RIGHT ICA:  Systolic 79 cm/sec, Diastolic 27 cm/sec CCA:  125 cm/sec SYSTOLIC ICA/CCA RATIO:  0.5 ECA:  124 cm/sec LEFT ICA:  Systolic 72 cm/sec, Diastolic 12 cm/sec CCA:  190 cm/sec SYSTOLIC ICA/CCA RATIO:  0.4 ECA:  112 cm/sec Right Brachial SBP: Not acquired Left Brachial SBP: Not acquired RIGHT CAROTID ARTERY: No significant calcified disease of the right common carotid artery. Intermediate waveform maintained. Heterogeneous plaque without significant calcifications at the right carotid bifurcation. Low resistance waveform of the right ICA. No significant tortuosity. RIGHT VERTEBRAL ARTERY: Antegrade flow with low resistance waveform. LEFT CAROTID ARTERY: No significant calcified disease of the left common carotid artery. Intermediate waveform maintained. Heterogeneous plaque at the left carotid bifurcation without significant calcifications. Low resistance waveform of the left ICA. LEFT VERTEBRAL ARTERY:  Antegrade flow with low resistance waveform. IMPRESSION: Color duplex indicates minimal heterogeneous plaque, with no  hemodynamically significant stenosis by duplex criteria in the extracranial cerebrovascular circulation. Signed, Yvone Neu. Loreta Ave, DO [facility] Electronically Signed   By: Gilmer Mor D.O.   On: 08/30/2015 08:30    2D ECHO: Study Conclusions  - Left ventricle: The cavity size was normal. Wall thickness was normal. Systolic function was normal. The estimated ejection fraction was in the range of 60% to 65%. Wall motion was normal; there were no regional wall motion abnormalities. Left ventricular diastolic function parameters were normal. - Aortic valve: Valve area (VTI): 2.55 cm^2. Valve area (Vmax): 2.92 cm^2. - Atrial septum: No defect or patent foramen ovale was identified.   Disposition and Follow-up: Discharge Instructions    Diet - low sodium heart healthy    Complete by:  As directed      Increase activity slowly    Complete by:  As directed             DISPOSITION: home    DISCHARGE FOLLOW-UP Follow-up Information    Follow up with [facility], KOFI, MD In 2 weeks.   Specialty:  Neurology   Why:  for hospital follow-up  Contact information:   [address] [place] [address] [phone]       Follow up with Trinna PostKOBERLEIN, JUNELL CAROL, MD In 2 weeks.   Specialty:  Family Medicine   Why:  for hospital follow-up   Contact information:   [address] [place] [address] [phone]        Time spent on Discharge: 35mins   Signed:   RAI,RIPUDEEP M.D. [facility] 08/31/2015, 6:33 PM Pager: [phone]

## 2015-08-31 NOTE — Procedures (Signed)
  Current facility-administered medications:  .  aspirin tablet 325 mg, 325 mg, Oral, Daily, [place] SirenPeter Le, MD, 325 mg at 08/31/15 0934 .  atorvastatin (LIPITOR) tablet 10 mg, 10 mg, Oral, q1800, Ripudeep K Rai, MD, 10 mg at 08/31/15 1734 .  enoxaparin (LOVENOX) injection 40 mg, 40 mg, Subcutaneous, Q24H, Houston SirenPeter Le, MD, 40 mg at 08/31/15 0600 .  nicotine (NICODERM CQ - dosed in mg/24 hours) patch 21 mg, 21 mg, Transdermal, Daily, Albertine GratesFang Xu, MD, 21 mg at 08/30/15 1530 .  senna-docusate (Senokot-S) tablet 1 tablet, 1 tablet, Oral, QHS PRN, Houston SirenPeter Le, MD  Introduction:  This is a 19 channel routine scalp EEG performed at the bedside with bipolar and monopolar montages arranged in accordance to the international 10/20 system of electrode placement. One channel was dedicated to EKG recording.    Findings:  The background rhythm was normal 10-11 Hz alpha . No definite evidence of abnormal epileptiform discharges or electrographic seizures were noted during this recording.   Impression:  Unremarkable awake and drowsy routine inpatient EEG. Clinical correlation is recommended .

## 2015-08-31 NOTE — Progress Notes (Signed)
Patient ID: Nicholas Schmidt, male   DOB: 21-Feb-1971, 45 y.o.   MRN: [id number]   [place] A. Merlene Laughter, MD     [email or web address]          Nicholas Schmidt is an 45 y.o. male.   Assessment/Plan: MRI negative focal neurological symptoms involving the left upper extremity and associated with visual symptoms. 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 Additionally, partial seizures is also a possibility. It is less likely ischemic in nature. The patient does have a history of cocaine use which could complicate the diagnostic picture however.  RECOMMENDATION:  The patient is fairly well. He has had no recurrent episodes. The EEG results are reviewed and are normal. I suspect that the patient's spells are most likely due to complex migraine. For now we'll watch the patient that he may need to be on prophylactic treatment for his episodes.       GENERAL: Pleasant in no acute distress.  HEENT: Supple. Atraumatic normocephalic.   ABDOMEN: soft  EXTREMITIES: No edema   BACK: Normal.  SKIN: Normal by inspection.   MENTAL STATUS: Alert and oriented. Speech, language and cognition are generally intact. Judgment and insight normal.   CRANIAL NERVES: Pupils are equal, round and reactive to light and accommodation; extra ocular movements are full, there is no significant nystagmus; visual fields are full; upper and lower facial muscles are normal in strength and symmetric, there is no flattening of the nasolabial folds; tongue is midline; uvula is midline; shoulder elevation is normal.  MOTOR: Normal tone, bulk and strength; no pronator drift.  SENSATION: Normal to light touch.   TTE - Left ventricle: The cavity size was normal. Wall thickness was normal. Systolic function was normal. The estimated ejection fraction was in the range of 60% to 65%. Wall  motion was normal; there were no regional wall motion abnormalities. Left ventricular diastolic function parameters were normal. - Aortic valve: Valve area (VTI): 2.55 cm^2. Valve area (Vmax): 2.92 cm^2. - Atrial septum: No defect or patent foramen ovale was identified.  CAROTID DOPP NORMAL  BRAIN MRI/MRA NORMAL      Objective: Vital signs in last 24 hours: Temp:  [97.7 F (36.5 C)-98.8 F (37.1 C)] 97.9 F (36.6 C) (03/03 1230) Pulse Rate:  [71-87] 87 (03/03 1230) Resp:  [20] 20 (03/03 1230) BP: (122-142)/(64-72) 142/67 mmHg (03/03 1230) SpO2:  [99 %-100 %] 100 % (03/03 1230)  Intake/Output from previous day: 03/02 0701 - 03/03 0700 In: -  Out: 750 [Urine:750] Intake/Output this shift:   Nutritional status: Diet Heart Room service appropriate?: Yes; Fluid consistency:: Thin   Lab Results: Results for orders placed or performed during the hospital encounter of 08/30/15 (from the past 48 hour(s))  Basic metabolic panel     Status: None   Collection Time: 08/30/15 12:51 AM  Result Value Ref Range   Sodium 141 135 - 145 mmol/L   Potassium 4.8 3.5 - 5.1 mmol/L   Chloride 106 101 - 111 mmol/L   CO2 29 22 - 32 mmol/L   Glucose, Bld 92 65 - 99 mg/dL   BUN 9 6 - 20 mg/dL   Creatinine, Ser 1.11 0.61 - 1.24 mg/dL   Calcium 9.6 8.9 - 10.3 mg/dL   GFR calc non Af Amer >60 >60 mL/min   GFR calc Af Amer >60 >60 mL/min    Comment: (NOTE) The eGFR has been calculated using  the CKD EPI equation. This calculation has not been validated in all clinical situations. eGFR's persistently <60 mL/min signify possible Chronic Kidney Disease.    Anion gap 6 5 - 15  CBC with Differential/Platelet     Status: Abnormal   Collection Time: 08/30/15 12:51 AM  Result Value Ref Range   WBC 8.6 4.0 - 10.5 K/uL   RBC 4.39 4.22 - 5.81 MIL/uL   Hemoglobin 15.2 13.0 - 17.0 g/dL   HCT 45.0 39.0 - 52.0 %   MCV 102.5 (H) 78.0 - 100.0 fL   MCH 34.6 (H) 26.0 - 34.0 pg   MCHC 33.8 30.0 -  36.0 g/dL   RDW 12.8 11.5 - 15.5 %   Platelets 168 150 - 400 K/uL   Neutrophils Relative % 49 %   Neutro Abs 4.3 1.7 - 7.7 K/uL   Lymphocytes Relative 42 %   Lymphs Abs 3.6 0.7 - 4.0 K/uL   Monocytes Relative 7 %   Monocytes Absolute 0.6 0.1 - 1.0 K/uL   Eosinophils Relative 2 %   Eosinophils Absolute 0.1 0.0 - 0.7 K/uL   Basophils Relative 0 %   Basophils Absolute 0.0 0.0 - 0.1 K/uL  Vitamin B12     Status: None   Collection Time: 08/30/15 12:51 AM  Result Value Ref Range   Vitamin B-12 614 180 - 914 pg/mL    Comment: (NOTE) This assay is not validated for testing neonatal or myeloproliferative syndrome specimens for Vitamin B12 levels. Performed at [facility]   Folate RBC     Status: None   Collection Time: 08/30/15  5:30 AM  Result Value Ref Range   Folate, Hemolysate 403.6 Not Estab. ng/mL   Hematocrit 41.8 37.5 - 51.0 %   Folate, RBC 966 >498 ng/mL    Comment: (NOTE) Performed At: [facility] [place], [place] [id number] Lindon Romp MD [id number]   Hemoglobin A1c     Status: Abnormal   Collection Time: 08/30/15  5:30 AM  Result Value Ref Range   Hgb A1c MFr Bld 4.7 (L) 4.8 - 5.6 %    Comment: (NOTE)         Pre-diabetes: 5.7 - 6.4         Diabetes: >6.4         Glycemic control for adults with diabetes: <7.0    Mean Plasma Glucose 88 mg/dL    Comment: (NOTE) Performed At: [facility] [place], [place] [id number] Lindon Romp MD VF:[id number]   Lipid panel     Status: Abnormal   Collection Time: 08/30/15  5:30 AM  Result Value Ref Range   Cholesterol 152 0 - 200 mg/dL   Triglycerides 118 <150 mg/dL   HDL 37 (L) >40 mg/dL   Total CHOL/HDL Ratio 4.1 RATIO   VLDL 24 0 - 40 mg/dL   LDL Cholesterol 91 0 - 99 mg/dL    Comment:        Total Cholesterol/HDL:CHD Risk Coronary Heart Disease Risk Table                     Men   Women  1/2 Average Risk   3.4   3.3  Average Risk       5.0   4.4  2  X Average Risk   9.6   7.1  3 X Average Risk  23.4   11.0        Use the calculated  Patient Ratio above and the CHD Risk Table to determine the patient's CHD Risk.        ATP III CLASSIFICATION (LDL):  <100     mg/dL   Optimal  100-129  mg/dL   Near or Above                    Optimal  130-159  mg/dL   Borderline  160-189  mg/dL   High  >190     mg/dL   Very High   TSH     Status: None   Collection Time: 08/30/15  9:17 PM  Result Value Ref Range   TSH 0.577 0.350 - 4.500 uIU/mL  Urine rapid drug screen (hosp performed)     Status: Abnormal   Collection Time: 08/30/15 11:00 PM  Result Value Ref Range   Opiates NONE DETECTED NONE DETECTED   Cocaine POSITIVE (A) NONE DETECTED   Benzodiazepines NONE DETECTED NONE DETECTED   Amphetamines NONE DETECTED NONE DETECTED   Tetrahydrocannabinol NONE DETECTED NONE DETECTED   Barbiturates NONE DETECTED NONE DETECTED    Comment:        DRUG SCREEN FOR MEDICAL PURPOSES ONLY.  IF CONFIRMATION IS NEEDED FOR ANY PURPOSE, NOTIFY LAB WITHIN 5 DAYS.        LOWEST DETECTABLE LIMITS FOR URINE DRUG SCREEN Drug Class       Cutoff (ng/mL) Amphetamine      1000 Barbiturate      200 Benzodiazepine   182 Tricyclics       993 Opiates          300 Cocaine          300 THC              50     Lipid Panel  Recent Labs  08/30/15 0530  CHOL 152  TRIG 118  HDL 37*  CHOLHDL 4.1  VLDL 24  LDLCALC 91    Studies/Results:   Medications:  Scheduled Meds: . aspirin  325 mg Oral Daily  . atorvastatin  10 mg Oral q1800  . enoxaparin (LOVENOX) injection  40 mg Subcutaneous Q24H  . nicotine  21 mg Transdermal Daily   Continuous Infusions:  PRN Meds:.senna-docusate       Lyzette Reinhardt A. Merlene Laughter, M.D.  Diplomate, Tax adviser of Psychiatry and Neurology ( Neurology).

## 2015-08-31 NOTE — Progress Notes (Signed)
EEG Completed; Results Pending  

## 2015-08-31 NOTE — Evaluation (Signed)
Occupational Therapy Evaluation Patient Details Name: Nicholas Schmidt MRN: [id number] DOB: 17-Feb-1971 Today's Date: 08/31/2015    History of Present Illness Nicholas Schmidt is an 45 y.o. male with hx of prior TIA about 3 years ago, with negative work up including MRA/MRI/doppler of carotids (did show non obtructive plagues and vessel irregularity), HTN, hx of significant tobacco and cocaine use, presented to the ER with sudden onset of left upper arm paresthesia. He has no HA, N/V slurred speech or lower extremity weakness or numbness. Work up included a negative head CT, and EKG showed NSR. Serology was unremarkable, except MCV was 102. He denied significant alcohol use. Teleneurology consultation was done, and TPA was not recommended due to mild symptomology. Hospitalist was asked to admit him for TIA/CVA work up. He was previously on ASA, but he stopped.    Clinical Impression   Pt awake, alert, oriented x4 this am, agreeable to evaluation. Pt is at functional baseline & is independent in ADL tasks.  Pt reports symptoms have resolved. BUE ROM and strength WNL, coordination & sensation intact, vision back to baseline. No further OT services required.     Follow Up Recommendations  No OT follow up    Equipment Recommendations  None recommended by OT       Precautions / Restrictions Precautions Precautions: None Restrictions Weight Bearing Restrictions: No      Mobility Bed Mobility Overal bed mobility: Independent                Transfers Overall transfer level: Independent                         ADL Overall ADL's : Independent                                             Vision Vision Assessment?: No apparent visual deficits          Pertinent Vitals/Pain Pain Assessment: No/denies pain     Hand Dominance Right   Extremity/Trunk Assessment Upper Extremity Assessment Upper Extremity Assessment: Overall WFL for tasks  assessed   Lower Extremity Assessment Lower Extremity Assessment: Defer to PT evaluation       Communication Communication Communication: No difficulties   Cognition Arousal/Alertness: Awake/alert Behavior During Therapy: WFL for tasks assessed/performed Overall Cognitive Status: Within Functional Limits for tasks assessed                                Home Living Family/patient expects to be discharged to:: Private residence Living Arrangements: Alone                           Home Equipment: None          Prior Functioning/Environment Level of Independence: Independent              End of Session    Activity Tolerance: Patient tolerated treatment well Patient left: in bed;with call bell/phone within reach   Time: 4782-9562 OT Time Calculation (min): 16 min Charges:  OT General Charges $OT Visit: 1 Procedure OT Evaluation $OT Eval Low Complexity: 1 Procedure G-Codes: OT G-codes **NOT FOR INPATIENT CLASS** Functional Assessment Tool Used: clinical judgement Functional Limitation: Self care Self Care Current Status (Z3086): 0 percent impaired, limited  or restricted Self Care Goal Status [phone](G8988): 0 percent impaired, limited or restricted Self Care Discharge Status [phone](G8989): 0 percent impaired, limited or restricted  Ezra SitesLeslie Troxler, OTR/L  [phone]  08/31/2015, 10:19 AM

## 2015-08-31 NOTE — Progress Notes (Signed)
[facility]                                                                              Patient Demographics  Nicholas Schmidt, is a 45 y.o. male, DOB - 01-17-1971, ZOX:[id number]  Admit date - 08/30/2015   Admitting Physician Houston Siren, MD  Outpatient Primary MD for the patient is [name], MD  LOS -    Chief Complaint  Patient presents with  . Arm Pain       Brief HPI  Nicholas Schmidt is an 45 y.o. male with hx of prior TIA about 3 years ago, with negative work up including MRA/MRI/doppler of carotids (did show non obtructive plagues and vessel irregularity), HTN, hx of significant tobacco and cocaine use, presented to the ER with sudden onset of left upper arm paresthesia. He has no HA, N/V slurred speech or lower extremity weakness or numbness. Work up included a negative head CT, and EKG showed NSR. Serology was unremarkable, except MCV was 102. He denied significant alcohol use. Teleneurology consultation was done, and TPA was not recommended due to mild symptomology. Hospitalist was asked to admit him for TIA/CVA work up. He was previously on ASA, but he stopped.    Assessment & Plan    Principal Problem:   TIA (transient ischemic attack) presenting with sudden onset of left upper arm paresthesias, visual symptoms. Patient is noncompliant with aspirin,+ cocaine abuse. Per neurology, partial seizures is also a possibility or most likely complex migraine/migraine with aura. - Neurology was consulted. - MRI brain showed no acute infarct - MRA showed normal intracranial circulation  - 2-D echo showed EF 60-65%, no PFO - Carotid Dopplers showed no ICA stenosis involving the right ICA and left ICA - Neurology recommended continuing aspirin 325 mg daily - Lipid panel showed LDL 91 , goal less than 70, placed on statin - Hemoglobin A1c 4.7 - PT evaluation recommending no PT follow-up, patient ambulating in the hallways - Neurology, Dr. Gerilyn Pilgrim  recommended EEG - TSH 0.5, RPR, homocystine, HIV pending    Tobacco abuse - Patient counseled on nicotine cessation    Polysubstance abuse - UDS positive for cocaine, patient recommended to quit cocaine  Macrocytosis - Likely due to alcohol use, MCV 102, B12 614, folate pending  Code Status: Full code  Family Communication: Discussed in detail with the patient, all imaging results, lab results explained to the patient    Disposition Plan: EEG pending  Time Spent in minutes   25 minutes  Procedures  MRI, MRA 2DECHO Carotids   Consults   Neurology  DVT Prophylaxis  Lovenox  Medications  Scheduled Meds: . aspirin  325 mg Oral Daily  . atorvastatin  10 mg Oral q1800  . enoxaparin (LOVENOX) injection  40 mg Subcutaneous Q24H  . nicotine  21 mg Transdermal Daily   Continuous Infusions:  PRN Meds:.senna-docusate   Antibiotics   Anti-infectives    None        Subjective:   [name] was seen and examined today. Patient denies dizziness, chest pain, shortness of breath, abdominal pain, N/V/D/C, new weakness, numbess, tingling. No acute events overnight.  Objective:   Filed Vitals:   08/31/15 0030 08/31/15 0230 08/31/15 0430 08/31/15 0843  BP: 128/66 130/68 128/72 140/71  Pulse: 77 76 72 71  Temp: 98.2 F (36.8 C) 98.5 F (36.9 C) 98.5 F (36.9 C) 97.7 F (36.5 C)  TempSrc: Oral Oral Oral Oral  Resp: 20 20 20 20   Height:      Weight:      SpO2: 100% 100% 100% 100%    Intake/Output Summary (Last 24 hours) at 08/31/15 1333 Last data filed at 08/31/15 0118  Gross per 24 hour  Intake      0 ml  Output    750 ml  Net   -750 ml     Wt Readings from Last 3 Encounters:  08/30/15 95.255 kg (210 lb)  01/18/15 95.255 kg (210 lb)  11/20/14 92.987 kg (205 lb)     Exam  General: Alert and oriented x 3, NAD  HEENT:  PERRLA, EOMI, Anicteric Sclera, mucous membranes moist.   Neck: Supple, no JVD, no masses  CVS: S1 S2 auscultated, no rubs,  murmurs or gallops. Regular rate and rhythm.  Respiratory: Clear to auscultation bilaterally, no wheezing, rales or rhonchi  Abdomen: Soft, nontender, nondistended, + bowel sounds  Ext: no cyanosis clubbing or edema  Neuro: AAOx3, Cr N's II- XII. Strength 5/5 upper and lower extremities bilaterally  Skin: No rashes  Psych: Normal affect and demeanor, alert and oriented x3    Data Review   Micro Results No results found for this or any previous visit (from the past 240 hour(s)).  Radiology Reports Ct Head Wo Contrast  08/30/2015  CLINICAL DATA:  Acute onset LEFT arm pain 3 hours prior to arrival associated with tingling and lightheadedness. History of TIA. EXAM: CT HEAD WITHOUT CONTRAST TECHNIQUE: Contiguous axial images were obtained from the base of the skull through the vertex without intravenous contrast. COMPARISON:  MRI of the head August 28, 2012 FINDINGS: The ventricles and sulci are normal. No intraparenchymal hemorrhage, mass effect nor midline shift. No acute large vascular territory infarcts. No abnormal extra-axial fluid collections. Basal cisterns are patent. No skull fracture. The included ocular globes and orbital contents are non-suspicious. Small less sphenoid air-fluid level. The mastoid air cells are well aerated. IMPRESSION: Negative CT head. Electronically Signed   By: Awilda Metroourtnay  Bloomer M.D.   On: 08/30/2015 01:20   Mr Nicholas GlennMra Head Wo Contrast  08/30/2015  CLINICAL DATA:  Left-sided weakness and dizziness for 1 day.  TIA. EXAM: MRA HEAD WITHOUT CONTRAST TECHNIQUE: Angiographic images of the Circle of Willis were obtained using MRA technique without intravenous contrast. COMPARISON:  MRI brain from the same day.  MRA head 08/28/2012. FINDINGS: The study is moderately degraded by patient motion. Asymmetric caliber of the right internal carotid artery is again noted, likely related to the hypoplastic right A1 segment. The cavernous internal carotid arteries are displaced by patient  motion. The left A1 segment is intact. Both A2 segments originate from the left with a patent anterior communicating artery. 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 The MCA branch vessels are intact bilaterally. The vertebral arteries are codominant. The vertebrobasilar junction is normal. Prominent AICA vessels are noted bilaterally. Both posterior cerebral arteries originate from the basilar tip. The PCA branch vessels are intact. IMPRESSION: 1. No significant interval change. 2. The study is moderately degraded by patient motion. 3. Distal small vessel disease is suspected, but specificity is degraded by patient motion. Electronically Signed  By: Marin Roberts M.D.   On: 08/30/2015 08:39   Mr Brain Wo Contrast  08/30/2015  CLINICAL DATA:  Left-sided weakness and dizziness for 1 day.  TIA. EXAM: MRI HEAD WITHOUT CONTRAST TECHNIQUE: Multiplanar, multiecho pulse sequences of the brain and surrounding structures were obtained without intravenous contrast. COMPARISON:  CT head without contrast 08/30/2015. MRI brain 08/28/2012. FINDINGS: No acute infarct, hemorrhage, or mass lesion is present. The ventricles are of normal size. No significant extraaxial fluid collection is present. No significant white matter disease is present. The internal auditory canals are within normal limits. Flow is present in the major intracranial arteries. The globes and orbits are intact. A fluid level is present in the left sphenoid sinus. The paranasal sinuses are otherwise clear. The mastoid air cells are clear. Skullbase is within normal limits. Midline sagittal images are unremarkable. IMPRESSION: Negative MRI of the brain. Electronically Signed   By: Marin Roberts M.D.   On: 08/30/2015 08:42   US Carotid Bilateral  08/30/2015  CLINICAL DATA:  45 year old with a history of TIA. Cardiovascular risk factors include prior stroke/ TIA, tobacco use EXAM: BILATERAL CAROTID DUPLEX  ULTRASOUND TECHNIQUE: Wallace Cullens scale imaging, color Doppler and duplex ultrasound were performed of bilateral carotid and vertebral arteries in the neck. COMPARISON:  No prior duplex FINDINGS: Criteria: Quantification of carotid stenosis is based on velocity parameters that correlate the residual internal carotid diameter with NASCET-based stenosis levels, using the diameter of the distal internal carotid lumen as the denominator for stenosis measurement. The following velocity measurements were obtained: RIGHT ICA:  Systolic 79 cm/sec, Diastolic 27 cm/sec CCA:  125 cm/sec SYSTOLIC ICA/CCA RATIO:  0.5 ECA:  124 cm/sec LEFT ICA:  Systolic 72 cm/sec, Diastolic 12 cm/sec CCA:  190 cm/sec SYSTOLIC ICA/CCA RATIO:  0.4 ECA:  112 cm/sec Right Brachial SBP: Not acquired Left Brachial SBP: Not acquired RIGHT CAROTID ARTERY: No significant calcified disease of the right common carotid artery. Intermediate waveform maintained. Heterogeneous plaque without significant calcifications at the right carotid bifurcation. Low resistance waveform of the right ICA. No significant tortuosity. RIGHT VERTEBRAL ARTERY: Antegrade flow with low resistance waveform. LEFT CAROTID ARTERY: No significant calcified disease of the left common carotid artery. Intermediate waveform maintained. Heterogeneous plaque at the left carotid bifurcation without significant calcifications. Low resistance waveform of the left ICA. LEFT VERTEBRAL ARTERY:  Antegrade flow with low resistance waveform. IMPRESSION: Color duplex indicates minimal heterogeneous plaque, with no hemodynamically significant stenosis by duplex criteria in the extracranial cerebrovascular circulation. Signed, Yvone Neu. Loreta Ave, DO [facility] Radiology Electronically Signed   By: Gilmer Mor D.O.   On: 08/30/2015 08:30    CBC  Recent Labs Lab 08/30/15 0051  WBC 8.6  HGB 15.2  HCT 45.0  PLT 168  MCV 102.5*  MCH 34.6*  MCHC 33.8    RDW 12.8  LYMPHSABS 3.6  MONOABS 0.6  EOSABS 0.1  BASOSABS 0.0    Chemistries   Recent Labs Lab 08/30/15 0051  NA 141  K 4.8  CL 106  CO2 29  GLUCOSE 92  BUN 9  CREATININE 1.11  CALCIUM 9.6   ------------------------------------------------------------------------------------------------------------------ estimated creatinine clearance is 102.6 mL/min (by C-G formula based on Cr of 1.11). ------------------------------------------------------------------------------------------------------------------  Recent Labs  08/30/15 0530  HGBA1C 4.7*   ------------------------------------------------------------------------------------------------------------------  Recent Labs  08/30/15 0530  CHOL 152  HDL 37*  LDLCALC 91  TRIG 161  CHOLHDL 4.1   ------------------------------------------------------------------------------------------------------------------  Recent Labs  08/30/15 2117  TSH 0.577   ------------------------------------------------------------------------------------------------------------------  Recent Labs  08/30/15 0051  VITAMINB12 614    Coagulation profile No results for input(s): INR, PROTIME in the last 168 hours.  No results for input(s): DDIMER in the last 72 hours.  Cardiac Enzymes No results for input(s): CKMB, TROPONINI, MYOGLOBIN in the last 168 hours.  Invalid input(s): CK ------------------------------------------------------------------------------------------------------------------ Invalid input(s): POCBNP  No results for input(s): GLUCAP in the last 72 hours.   Sesilia Poucher M.D. [facility] 08/31/2015, 1:33 PM  Pager: [phone] Between 7am to 7pm - call Pager - [phone]  After 7pm go to [email or web address] - password [email or web address]  Call night coverage person covering after 7pm

## 2015-09-01 LAB — HIV ANTIBODY (ROUTINE TESTING W REFLEX): HIV Screen 4th Generation wRfx: NONREACTIVE

## 2015-09-01 LAB — RPR: RPR Ser Ql: NONREACTIVE

## 2015-09-01 NOTE — Procedures (Signed)
  [facility] Nicholas Merkle A. [name], MD     [email or web address]           HISTORY: Patient is a 45 year old who presents with episodes of left upper extremity tingling associated with twitching of the left eye and visual symptoms.  MEDICATIONS: Scheduled Meds: Continuous Infusions: PRN Meds:.  Prior to Admission medications   Medication Sig Start Date End Date Taking? Authorizing Provider  aspirin 325 MG tablet Take 1 tablet (325 mg total) by mouth daily. 08/31/15   Ripudeep Jenna LuoK Rai, MD  atorvastatin (LIPITOR) 10 MG tablet Take 1 tablet (10 mg total) by mouth at bedtime. 08/31/15   Ripudeep Jenna LuoK Rai, MD  nicotine (NICODERM CQ - DOSED IN MG/24 HOURS) 21 mg/24hr patch Place 1 patch (21 mg total) onto the skin daily. 08/31/15   Ripudeep Jenna LuoK Rai, MD      ANALYSIS: A 16 channel recording using standard 10 20 measurements is conducted for 21 minutes. There is a well-formed posterior dominant rhythm of 12-12-1/2 Hz which attenuates with eye opening. There is beta activity is seen in the frontal areas. A large portion of the recording is conducted during sleep with K complexes and sleep spindles observed. Photic stimulation is carried out without abnormal changes in the background activity. There is no focal or lateral slowing. There is no epileptiform activity observed.   IMPRESSION: This is a normal recording of the awake and sleep states.      [name] A. Gerilyn Schmidt, M.D.  Diplomate, Biomedical engineerAmerican Board of Psychiatry and Neurology ( Neurology).

## 2015-09-02 LAB — HOMOCYSTEINE: Homocysteine: 8.1 umol/L (ref 0.0–15.0)

## 2016-09-02 ENCOUNTER — Emergency Department (HOSPITAL_COMMUNITY)
Admission: EM | Admit: 2016-09-02 | Discharge: 2016-09-02 | Disposition: A | Discharge status: Home / Self Care | Payer: BLUE CROSS/BLUE SHIELD | Attending: Emergency Medicine | Admitting: Emergency Medicine

## 2016-09-02 ENCOUNTER — Encounter (HOSPITAL_COMMUNITY): Payer: Self-pay | Admitting: *Deleted

## 2016-09-02 DIAGNOSIS — R51 Headache: Secondary | ICD-10-CM | POA: Diagnosis present

## 2016-09-02 DIAGNOSIS — G43109 Migraine with aura, not intractable, without status migrainosus: Secondary | ICD-10-CM | POA: Insufficient documentation

## 2016-09-02 DIAGNOSIS — F17210 Nicotine dependence, cigarettes, uncomplicated: Secondary | ICD-10-CM | POA: Diagnosis not present

## 2016-09-02 MED ORDER — DEXAMETHASONE SODIUM PHOSPHATE 10 MG/ML IJ SOLN
10.0000 mg | Freq: Once | INTRAMUSCULAR | Status: AC
  Administered 2016-09-02: 10 mg via INTRAVENOUS
  Filled 2016-09-02: qty 1

## 2016-09-02 MED ORDER — DIPHENHYDRAMINE HCL 50 MG/ML IJ SOLN
25.0000 mg | Freq: Once | INTRAMUSCULAR | Status: AC
  Administered 2016-09-02: 25 mg via INTRAVENOUS
  Filled 2016-09-02: qty 1

## 2016-09-02 MED ORDER — METOCLOPRAMIDE HCL 5 MG/ML IJ SOLN
10.0000 mg | Freq: Once | INTRAMUSCULAR | Status: AC
  Administered 2016-09-02: 10 mg via INTRAVENOUS
  Filled 2016-09-02: qty 2

## 2016-09-02 MED ORDER — SODIUM CHLORIDE 0.9 % IV BOLUS (SEPSIS)
1000.0000 mL | Freq: Once | INTRAVENOUS | Status: AC
  Administered 2016-09-02: 1000 mL via INTRAVENOUS

## 2016-09-02 MED ORDER — SODIUM CHLORIDE 0.9 % IV BOLUS (SEPSIS)
500.0000 mL | Freq: Once | INTRAVENOUS | Status: AC
  Administered 2016-09-02: 500 mL via INTRAVENOUS

## 2016-09-02 NOTE — ED Provider Notes (Signed)
[facility]-EM[facility]ENCY DEPT Provider Note   CSN: [id number] Arrival date & time: 09/02/16  0136  Time seen 01:50 AM   History   Chief Complaint Chief Complaint  Patient presents with  . Headache    HPI Nicholas Schmidt is a 46 y.o. male.  HPI patient states about an hour ago he started seeing spots that he describes as bright stars that are moving around or shooting around his line of vision, sometimes he even sees wavy lines. He states he then felt lightheaded and then started getting a headache. He states it's frontal and he describes it as throbbing like a hammer. He states he's had these headaches before  with stress. The last time was a year ago. He states he is under stress now. He states at that time he was diagnosed with a TIA however his medical doctor did not think he had a TIA. He denies nausea, vomiting, blurred vision, numbness or tingling of his extremities except he states he feels like his left ankle felt a little bit numb about 15 minutes after he started seeing spots. Although he was put on  cholesterol medicine and aspirin when he left the hospital he is not currently taking them.  PCP Colette RibasGOLDING, JOHN CABOT, MD   Past Medical History:  Diagnosis Date  . TIA (transient ischemic attack)     Patient Active Problem List   Diagnosis Date Noted  . Polysubstance abuse 08/30/2015  . TIA (transient ischemic attack) 08/28/2012  . Tobacco abuse 08/28/2012    History reviewed. No pertinent surgical history.     Home Medications    Prior to Admission medications   Not on File    Family History History reviewed. No pertinent family history.  Social History Social History  Substance Use Topics  . Smoking status: Current Every Day Smoker    Packs/day: 1.00    Types: Cigarettes  . Smokeless tobacco: Never Used  . Alcohol use Yes     Comment: Pt reports "6 pack" a day  employed    Allergies   Bee venom   Review of Systems Review of Systems  All other systems  reviewed and are negative.    Physical Exam Updated Vital Signs BP 120/74 (BP Location: Left Arm)   Pulse 84   Temp 98.7 F (37.1 C) (Oral)   Resp 18   Ht 6' 3.5" (1.918 m)   Wt 215 lb (97.5 kg)   SpO2 98%   BMI 26.52 kg/m   Vital signs normal    Physical Exam  Constitutional: He is oriented to person, place, and time. He appears well-developed and well-nourished.  Non-toxic appearance. He does not appear ill. No distress.  When I walk in the room the patient is in no distress holding a phone in each hand.  HENT:  Head: Normocephalic and atraumatic.  Right Ear: External ear normal.  Left Ear: External ear normal.  Nose: Nose normal. No mucosal edema or rhinorrhea.  Mouth/Throat: Oropharynx is clear and moist and mucous membranes are normal. No dental abscesses or uvula swelling.  Eyes: Conjunctivae and EOM are normal. Pupils are equal, round, and reactive to light.  Neck: Normal range of motion and full passive range of motion without pain. Neck supple.  Cardiovascular: Normal rate, regular rhythm and normal heart sounds.  Exam reveals no gallop and no friction rub.   No murmur heard. Pulmonary/Chest: Effort normal and breath sounds normal. No respiratory distress. He has no wheezes. He has no rhonchi. He has  no rales. He exhibits no tenderness and no crepitus.  Abdominal: Soft. Normal appearance and bowel sounds are normal. He exhibits no distension. There is no tenderness. There is no rebound and no guarding.  Musculoskeletal: Normal range of motion. He exhibits no edema or tenderness.  Moves all extremities well.   Neurological: He is alert and oriented to person, place, and time. He has normal strength. No cranial nerve deficit.  Grips are equal, patient is right-handed. There is no pronator drift. There is no motor weakness noted.  Skin: Skin is warm, dry and intact. No rash noted. No erythema. No pallor.  Psychiatric: He has a normal mood and affect. His speech is normal  and behavior is normal. His mood appears not anxious.  Nursing note and vitals reviewed.    ED Treatments / Results  Labs (all labs ordered are listed, but only abnormal results are displayed) Labs Reviewed - No data to display  EKG  EKG Interpretation None       Radiology No results found.   Study Result   CLINICAL DATA:  Left-sided weakness and dizziness for 1 day.  TIA.  EXAM: MRA HEAD WITHOUT CONTRAST  TECHNIQUE: Angiographic images of the Circle of Willis were obtained using MRA technique without intravenous contrast.  COMPARISON:  MRI brain from the same day.  MRA head 08/28/2012.  FINDINGS: The study is moderately degraded by patient motion. Asymmetric caliber of the right internal carotid artery is again noted, likely related to the hypoplastic right A1 segment. The cavernous internal carotid arteries are displaced by patient motion. The left A1 segment is intact. Both A2 segments originate from the left with a patent anterior communicating artery. 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 The MCA branch vessels are intact bilaterally.  The vertebral arteries are codominant. The vertebrobasilar junction is normal. Prominent AICA vessels are noted bilaterally. Both posterior cerebral arteries originate from the basilar tip. The PCA branch vessels are intact.  IMPRESSION: 1. No significant interval change. 2. The study is moderately degraded by patient motion. 3. Distal small vessel disease is suspected, but specificity is degraded by patient motion.   Electronically Signed   By: Marin Roberts M.D.   On: 08/30/2015 08:39   Study Result   CLINICAL DATA:  Left-sided weakness and dizziness for 1 day.  TIA.  EXAM: MRI HEAD WITHOUT CONTRAST  TECHNIQUE: Multiplanar, multiecho pulse sequences of the brain and surrounding structures were obtained without intravenous contrast.  COMPARISON:  CT head  without contrast 08/30/2015. MRI brain 08/28/2012.  FINDINGS: No acute infarct, hemorrhage, or mass lesion is present. The ventricles are of normal size. No significant extraaxial fluid collection is present.  No significant white matter disease is present.  The internal auditory canals are within normal limits. Flow is present in the major intracranial arteries. The globes and orbits are intact. A fluid level is present in the left sphenoid sinus. The paranasal sinuses are otherwise clear. The mastoid air cells are clear.  Skullbase is within normal limits. Midline sagittal images are unremarkable.  IMPRESSION: Negative MRI of the brain.   Electronically Signed   By: Marin Roberts M.D.   On: 08/30/2015 08:42     Procedures Procedures (including critical care time)  Medications Ordered in ED Medications  metoCLOPramide (REGLAN) injection 10 mg (10 mg Intravenous Given 09/02/16 0220)  diphenhydrAMINE (BENADRYL) injection 25 mg (25 mg Intravenous Given 09/02/16 0222)  dexamethasone (DECADRON) injection 10 mg (10 mg Intravenous  Given 09/02/16 0217)  sodium chloride 0.9 % bolus 1,000 mL (0 mLs Intravenous Stopped 09/02/16 0339)  sodium chloride 0.9 % bolus 500 mL (0 mLs Intravenous Stopped 09/02/16 0339)     Initial Impression / Assessment and Plan / ED Course  I have reviewed the triage vital signs and the nursing notes.  Pertinent labs & imaging results that were available during my care of the patient were reviewed by me and considered in my medical decision making (see chart for details).   Talking to patient he is describing a typical migraine with scotomata. He was given migraine cocktail medication.  When patient was rechecked at discharge his headache was almost gone. He felt improved enough to be discharged.  Final Clinical Impressions(s) / ED Diagnoses   Final diagnoses:  Migraine with aura and without status migrainosus, not intractable   Plan  discharge  Devoria Albe, MD, Concha Pyo, MD 09/02/16 [phone]

## 2016-09-02 NOTE — ED Notes (Signed)
Pt did have a slight lesser grip on left side than the right

## 2016-09-02 NOTE — ED Triage Notes (Signed)
Pt brought in by [facility] for c/o sudden onset of headache that started 30 mins pta; pt has a hx of TIA's; ems reports negative stroke screen

## 2016-09-02 NOTE — Discharge Instructions (Signed)
Go home and rest. Talk to Dr Phillips OdorGolding about your migraines, he may want to try some medication you can take at home to help stop the headache from getting worse.  Look at the information about recurrent migraines and see if there is something you are doing that could be making you get the migraines.

## 2018-01-27 NOTE — Congregational Nurse Program (Signed)
Congregational Nurse Program Note  Date of Encounter: 01/27/2018  Past Medical History: Past Medical History:  Diagnosis Date  . TIA (transient ischemic attack)     Encounter Details: CNP Questionnaire - 01/27/18 1014      Questionnaire   Patient Status  Not Applicable    Race  Black or African American    [facility]  [employer] [facility] [place]    Insurance  Private Insurance    Uninsured  Not Applicable    Food  No food insecurities    Housing/Utilities  Yes, have permanent housing    Transportation  No transportation needs    Interpersonal Safety  Yes, feel physically and emotionally safe where you currently live    Medication  No medication insecurities    Medical Provider  Yes    Referrals  Not Applicable    ED Visit Averted  Not Applicable    Life-Saving Intervention Made  Not Applicable      Seen at the food pantry. B134/80  P -[phone] [place] Kun[place] [facility] [phone]

## 2023-02-11 ENCOUNTER — Other Ambulatory Visit: Payer: Self-pay

## 2023-02-11 ENCOUNTER — Emergency Department (HOSPITAL_COMMUNITY)
Admission: EM | Admit: 2023-02-11 | Discharge: 2023-02-11 | Disposition: A | Discharge status: Home / Self Care | Payer: PRIVATE HEALTH INSURANCE | Attending: Emergency Medicine | Admitting: Emergency Medicine

## 2023-02-11 ENCOUNTER — Encounter (HOSPITAL_COMMUNITY): Payer: Self-pay | Admitting: Emergency Medicine

## 2023-02-11 DIAGNOSIS — J069 Acute upper respiratory infection, unspecified: Secondary | ICD-10-CM | POA: Insufficient documentation

## 2023-02-11 DIAGNOSIS — R197 Diarrhea, unspecified: Secondary | ICD-10-CM | POA: Insufficient documentation

## 2023-02-11 DIAGNOSIS — Z20822 Contact with and (suspected) exposure to covid-19: Secondary | ICD-10-CM | POA: Insufficient documentation

## 2023-02-11 DIAGNOSIS — R059 Cough, unspecified: Secondary | ICD-10-CM | POA: Diagnosis present

## 2023-02-11 LAB — RESP PANEL BY RT-PCR (RSV, FLU A&B, COVID)  RVPGX2
Influenza A by PCR: NEGATIVE
Influenza B by PCR: NEGATIVE
Resp Syncytial Virus by PCR: NEGATIVE
SARS Coronavirus 2 by RT PCR: NEGATIVE

## 2023-02-11 NOTE — ED Provider Notes (Signed)
[facility] Provider Note   CSN: [id number] Arrival date & time: 02/11/23  8469     History  Chief Complaint  Patient presents with   Covid Exposure    Nicholas Schmidt is a 52 y.o. male.  52 year old male presents today for concern of COVID exposure last week.  Now he reports fatigue, cough, diarrhea, and bodyaches.  No shortness of breath or chest pain.  He states he was riding in a pickup truck with his coworker on Wednesday and Thursday of last week.  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  No other complaints.  The history is provided by the patient. No language interpreter was used.       Home Medications Prior to Admission medications   Not on File      Allergies    Bee venom    Review of Systems   Review of Systems  Constitutional:  Positive for chills. Negative for fever.  HENT:  Negative for sore throat.   Respiratory:  Positive for cough. Negative for shortness of breath.   Cardiovascular:  Negative for chest pain.  Musculoskeletal:  Positive for myalgias.  Neurological:  Negative for light-headedness and headaches.  All other systems reviewed and are negative.   Physical Exam Updated Vital Signs BP (!) 152/86   Pulse 92   Temp 97.6 F (36.4 C) (Oral)   Resp 18   Wt 111.1 kg   SpO2 96%   BMI 30.22 kg/m  Physical Exam Vitals and nursing note reviewed.  Constitutional:      General: He is not in acute distress.    Appearance: Normal appearance. He is not ill-appearing.  HENT:     Head: Normocephalic and atraumatic.     Nose: Nose normal.  Eyes:     Conjunctiva/sclera: Conjunctivae normal.  Cardiovascular:     Rate and Rhythm: Normal rate and regular rhythm.  Pulmonary:     Effort: Pulmonary effort is normal. No respiratory distress.  Musculoskeletal:        General: No deformity. Normal range of motion.     Cervical back: Normal range of motion.  Skin:    Findings: No rash.  Neurological:     Mental  Status: He is alert.     ED Results / Procedures / Treatments   Labs (all labs ordered are listed, but only abnormal results are displayed) Labs Reviewed  RESP PANEL BY RT-PCR (RSV, FLU A&B, COVID)  [id number]    EKG None  Radiology No results found.  Procedures Procedures    Medications Ordered in ED Medications - No data to display  ED Course/ Medical Decision Making/ A&P                                 Medical Decision Making  52 year old male presents today for URI symptoms since yesterday.  He was exposed to someone who was COVID-positive at work mid last week.  He is well-appearing.  No acute distress.  Respiratory panel negative for COVID, flu, RSV.  Symptomatic management discussed.  He voices understanding and is in agreement with plan.  Discharged in stable condition.  Final Clinical Impression(s) / ED Diagnoses Final diagnoses:  Viral URI with cough    Rx / DC Orders ED Discharge Orders     None         Marita Kansas, PA-C 02/11/23 0754    Gloris Manchester,  MD 02/11/23 1633

## 2023-02-11 NOTE — ED Triage Notes (Signed)
Patient reports exposure to coworkers who have covid.  Patient reports diarrhea that started yesterday along with a cough.  Patient denies fevers.

## 2023-02-11 NOTE — Discharge Instructions (Signed)
Your COVID, flu, RSV test is negative.  For any concerning symptoms return to the emergency room.  Otherwise follow-up with your primary care provider.  Take Tylenol, ibuprofen for fever or muscle aches.  Drink plenty of fluids.

## 2024-05-04 ENCOUNTER — Ambulatory Visit (INDEPENDENT_AMBULATORY_CARE_PROVIDER_SITE_OTHER): Payer: PRIVATE HEALTH INSURANCE

## 2024-05-04 ENCOUNTER — Encounter (HOSPITAL_COMMUNITY): Payer: Self-pay | Admitting: Emergency Medicine

## 2024-05-04 ENCOUNTER — Ambulatory Visit (HOSPITAL_COMMUNITY)
Admission: EM | Admit: 2024-05-04 | Discharge: 2024-05-04 | Disposition: A | Discharge status: Home / Self Care | Payer: PRIVATE HEALTH INSURANCE | Attending: Internal Medicine | Admitting: Internal Medicine

## 2024-05-04 DIAGNOSIS — M25561 Pain in right knee: Secondary | ICD-10-CM

## 2024-05-04 DIAGNOSIS — G8929 Other chronic pain: Secondary | ICD-10-CM | POA: Diagnosis not present

## 2024-05-04 MED ORDER — MELOXICAM 15 MG PO TABS: 15.0000 mg | ORAL_TABLET | Freq: Every day | ORAL | 0 refills | Status: AC

## 2024-05-04 NOTE — Discharge Instructions (Signed)
 I believe your knee pain is ultimately due to arthritis. Continuing wearing the compression sleeve on your right knee. I prescribed meloxicam, which will replace Aleve  and other anti-inflammatory medications. OK to continue using tylenol  as needed. Please the the contact information below for the [facility] Sports Medicine office.   [phone]

## 2024-05-04 NOTE — ED Provider Notes (Signed)
 [facility]    CSN: [id number] Arrival date & time: 05/04/24  0857      History   Chief Complaint Chief Complaint  Patient presents with   Knee Pain    HPI Nicholas Schmidt is a 53 y.o. male presenting urgent care for evaluation of acute on chronic right knee pain.  He has a well-documented history of osteoarthritis of the right knee, most recently followed by orthopedic surgery with [facility] but has not been seen since Fall 2021.  Nicholas Schmidt states that his knee recently locked out on him.  He has pain with ambulation and is unable to work.  Pain is mostly medial.  He has tried taking Aleve  and Tylenol .  He has also been wearing a compression knee sleeve.  He would like to discuss additional treatment options for pain relief.  He states that steroid injections are ineffective.  He has tried gel injections in the past without sustained pain relief as well.  He also reports a medial meniscal tear.  Past Medical History:  Diagnosis Date   TIA (transient ischemic attack)     Patient Active Problem List   Diagnosis Date Noted   Polysubstance abuse (HCC) 08/30/2015   TIA (transient ischemic attack) 08/28/2012   Tobacco abuse 08/28/2012    History reviewed. No pertinent surgical history.     Home Medications    Prior to Admission medications   Medication Sig Start Date End Date Taking? Authorizing Provider  meloxicam (MOBIC) 15 MG tablet Take 1 tablet (15 mg total) by mouth daily. 05/04/24  Yes Melvenia Manus BRAVO, MD    Family History No family history on file.  Social History Social History   Tobacco Use   Smoking status: Every Day    Current packs/day: 1.00    Types: Cigarettes   Smokeless tobacco: Never  Substance Use Topics   Alcohol use: Yes    Comment: Pt reports 6 pack a day   Drug use: Yes    Types: Cocaine    Allergies   Bee venom  Review of Systems Review of Systems  Musculoskeletal:        Acute on chronic right knee pain   Physical  Exam Triage Vital Signs ED Triage Vitals  Encounter Vitals Group     BP 05/04/24 0925 132/76     Girls Systolic BP Percentile --      Girls Diastolic BP Percentile --      Boys Systolic BP Percentile --      Boys Diastolic BP Percentile --      Pulse Rate 05/04/24 0925 76     Resp 05/04/24 0925 17     Temp 05/04/24 0925 97.6 F (36.4 C)     Temp Source 05/04/24 0925 Oral     SpO2 05/04/24 0925 96 %     Weight --      Height --      Head Circumference --      Peak Flow --      Pain Score 05/04/24 0924 9     Pain Loc --      Pain Education --      Exclude from Growth Chart --    No data found.  Updated Vital Signs BP 132/76 (BP Location: Right Arm)   Pulse 76   Temp 97.6 F (36.4 C) (Oral)   Resp 17   SpO2 96%   Physical Exam Vitals reviewed.  Constitutional:      General: He is  not in acute distress.    Appearance: Normal appearance. He is not toxic-appearing.  Musculoskeletal:     Comments: Moderate effusion present on inspection of the right knee.  There is tenderness palpation over the medial and lateral joint lines as well as the patellar tendon.  ROM is significantly limited due to discomfort, 0-80 degrees of flexion.  Further examination is limited secondary to patient discomfort.  The right lower extremity is grossly NV intact.  Neurological:     Mental Status: He is alert.     UC Treatments / Results  Labs (all labs ordered are listed, but only abnormal results are displayed) Labs Reviewed - No data to display  EKG   Radiology DG Knee AP/LAT W/Sunrise Right Result Date: 05/04/2024 CLINICAL DATA:  Acute on chronic right knee pain. EXAM: RIGHT KNEE 3 VIEWS COMPARISON:  04/16/2012 FINDINGS: Moderate tricompartmental osteoarthritic change with interval progression. No acute fracture or dislocation. No significant joint effusion. IMPRESSION: 1. No acute findings. 2. Moderate tricompartmental osteoarthritic change with interval progression. Electronically  Signed   By: Toribio Agreste M.D.   On: 05/04/2024 10:16    Procedures Procedures (including critical care time)  Medications Ordered in UC Medications - No data to display  Initial Impression / Assessment and Plan / UC Course  I have reviewed the triage vital signs and the nursing notes.  Pertinent labs & imaging results that were available during my care of the patient were reviewed by me and considered in my medical decision making (see chart for details).    Patient is a 53 year old male who presents urgent care endorsing acute on chronic right knee pain.  Pain is mostly medial.  He has known osteoarthritis and a remote history of medial meniscal tear.  His knee recently locked out on him, causing his pain to increase.  He endorses difficulty ambulating.  Range of motion is limited secondary to pain.  Three-view x-rays of the right knee updated today showed moderate tricompartmental osteoarthritis, most severe along the medial compartment.  I believe his pain is due to an arthritic flare.  Treatment options reviewed.  I prescribed meloxicam 15 mg daily as needed for pain relief.  We also reviewed proper use of a knee compression sleeve.  He states that he has previously tried corticosteroid and gel injections.  He would like to hold off on knee replacement surgery as long as possible.  With this in mind, he was given contact information to follow-up in our sports medicine office to discuss additional treatment options.  He was counseled on avoiding all other NSAIDs while taking meloxicam.  Okay to continue as needed use of Tylenol .  He is agreeable to this plan and is medically stable for discharge at this time.  Final Clinical Impressions(s) / UC Diagnoses   Final diagnoses:  Chronic pain of right knee     Discharge Instructions      I believe your knee pain is ultimately due to arthritis. Continuing wearing the compression sleeve on your right knee. I prescribed meloxicam, which will  replace Aleve  and other anti-inflammatory medications. OK to continue using tylenol  as needed. Please the the contact information below for the [facility] office.   [phone]     ED Prescriptions     Medication Sig Dispense Auth. Provider   meloxicam (MOBIC) 15 MG tablet Take 1 tablet (15 mg total) by mouth daily. 30 tablet Breon Diss E, MD      PDMP not reviewed this  encounter.   Melvenia Manus BRAVO, MD 05/04/24 1027

## 2024-05-04 NOTE — ED Triage Notes (Signed)
 Pt reports that he tore his meniscus in right knee years back. Reports that doctor told him that he is bone on bone in right knee but too young to have knee replacement. Reports this morning having severe right knee pains. Took Tylenol  and Aleve . Work sent him home.
